# Patient Record
Sex: Female | Born: 1985 | Race: Black or African American | Hispanic: No | Marital: Single | Smoking: Never smoker
Health system: Southern US, Community
[De-identification: ages and names within clinical notes are randomized; demographics above are authoritative.]

## PROBLEM LIST (undated history)

## (undated) DIAGNOSIS — D219 Benign neoplasm of connective and other soft tissue, unspecified: Secondary | ICD-10-CM

## (undated) DIAGNOSIS — R87629 Unspecified abnormal cytological findings in specimens from vagina: Secondary | ICD-10-CM

## (undated) HISTORY — DX: Unspecified abnormal cytological findings in specimens from vagina: R87.629

## (undated) HISTORY — PX: OTHER SURGICAL HISTORY: SHX169

## (undated) HISTORY — PX: INDUCED ABORTION: SHX677

---
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
## 2005-01-29 ENCOUNTER — Emergency Department (HOSPITAL_COMMUNITY): Admission: EM | Admit: 2005-01-29 | Discharge: 2005-01-29 | Payer: Self-pay | Admitting: Emergency Medicine

## 2005-07-02 ENCOUNTER — Emergency Department (HOSPITAL_COMMUNITY): Admission: EM | Admit: 2005-07-02 | Discharge: 2005-07-02 | Payer: Self-pay | Admitting: Emergency Medicine

## 2005-11-13 ENCOUNTER — Inpatient Hospital Stay (HOSPITAL_COMMUNITY): Admission: AD | Admit: 2005-11-13 | Discharge: 2005-11-14 | Payer: Self-pay | Admitting: Obstetrics and Gynecology

## 2006-02-21 ENCOUNTER — Ambulatory Visit: Payer: Self-pay | Admitting: Family Medicine

## 2006-02-22 ENCOUNTER — Inpatient Hospital Stay (HOSPITAL_COMMUNITY): Admission: AD | Admit: 2006-02-22 | Discharge: 2006-02-24 | Payer: Self-pay | Admitting: Obstetrics and Gynecology

## 2006-02-22 ENCOUNTER — Ambulatory Visit: Payer: Self-pay | Admitting: Gynecology

## 2007-02-08 ENCOUNTER — Emergency Department (HOSPITAL_COMMUNITY): Admission: EM | Admit: 2007-02-08 | Discharge: 2007-02-08 | Payer: Self-pay | Admitting: Emergency Medicine

## 2008-02-19 ENCOUNTER — Emergency Department (HOSPITAL_COMMUNITY): Admission: EM | Admit: 2008-02-19 | Discharge: 2008-02-19 | Payer: Self-pay | Admitting: Emergency Medicine

## 2008-09-26 ENCOUNTER — Emergency Department (HOSPITAL_COMMUNITY): Admission: EM | Admit: 2008-09-26 | Discharge: 2008-09-26 | Payer: Self-pay | Admitting: Family Medicine

## 2010-02-02 ENCOUNTER — Emergency Department (HOSPITAL_COMMUNITY): Admission: EM | Admit: 2010-02-02 | Discharge: 2010-02-02 | Payer: Self-pay | Admitting: Emergency Medicine

## 2010-07-12 LAB — CULTURE, ROUTINE-ABSCESS

## 2017-06-16 ENCOUNTER — Emergency Department (HOSPITAL_BASED_OUTPATIENT_CLINIC_OR_DEPARTMENT_OTHER): Payer: Self-pay

## 2017-06-16 ENCOUNTER — Emergency Department (HOSPITAL_BASED_OUTPATIENT_CLINIC_OR_DEPARTMENT_OTHER)
Admission: EM | Admit: 2017-06-16 | Discharge: 2017-06-17 | Disposition: A | Discharge status: Home / Self Care | Payer: Self-pay | Attending: Emergency Medicine | Admitting: Emergency Medicine

## 2017-06-16 ENCOUNTER — Encounter (HOSPITAL_BASED_OUTPATIENT_CLINIC_OR_DEPARTMENT_OTHER): Payer: Self-pay | Admitting: Emergency Medicine

## 2017-06-16 ENCOUNTER — Other Ambulatory Visit: Payer: Self-pay

## 2017-06-16 DIAGNOSIS — R059 Cough, unspecified: Secondary | ICD-10-CM

## 2017-06-16 DIAGNOSIS — R05 Cough: Secondary | ICD-10-CM | POA: Insufficient documentation

## 2017-06-16 DIAGNOSIS — J029 Acute pharyngitis, unspecified: Secondary | ICD-10-CM | POA: Insufficient documentation

## 2017-06-16 DIAGNOSIS — R6883 Chills (without fever): Secondary | ICD-10-CM | POA: Insufficient documentation

## 2017-06-16 LAB — URINALYSIS, ROUTINE W REFLEX MICROSCOPIC
Glucose, UA: NEGATIVE mg/dL
Ketones, ur: 15 mg/dL — AB
Leukocytes, UA: NEGATIVE
Nitrite: NEGATIVE
PH: 6 (ref 5.0–8.0)
Protein, ur: NEGATIVE mg/dL
SPECIFIC GRAVITY, URINE: 1.025 (ref 1.005–1.030)

## 2017-06-16 LAB — COMPREHENSIVE METABOLIC PANEL
ALBUMIN: 3.9 g/dL (ref 3.5–5.0)
ALK PHOS: 61 U/L (ref 38–126)
ALT: 13 U/L — ABNORMAL LOW (ref 14–54)
ANION GAP: 10 (ref 5–15)
AST: 31 U/L (ref 15–41)
BUN: 11 mg/dL (ref 6–20)
CHLORIDE: 103 mmol/L (ref 101–111)
CO2: 24 mmol/L (ref 22–32)
Calcium: 8.6 mg/dL — ABNORMAL LOW (ref 8.9–10.3)
Creatinine, Ser: 1.1 mg/dL — ABNORMAL HIGH (ref 0.44–1.00)
GFR calc Af Amer: >60 mL/min (ref >60)
GFR calc non Af Amer: >60 mL/min (ref >60)
GLUCOSE: 102 mg/dL — AB (ref 65–99)
POTASSIUM: 3.5 mmol/L (ref 3.5–5.1)
Sodium: 137 mmol/L (ref 135–145)
Total Bilirubin: 0.5 mg/dL (ref 0.3–1.2)
Total Protein: 8.1 g/dL (ref 6.5–8.1)

## 2017-06-16 LAB — CBC
HEMATOCRIT: 34.9 % — AB (ref 36.0–46.0)
HEMOGLOBIN: 11.1 g/dL — AB (ref 12.0–15.0)
MCH: 23.6 pg — AB (ref 26.0–34.0)
MCHC: 31.8 g/dL (ref 30.0–36.0)
MCV: 74.3 fL — AB (ref 78.0–100.0)
Platelets: 215 10*3/uL (ref 150–400)
RBC: 4.7 MIL/uL (ref 3.87–5.11)
RDW: 15.4 % (ref 11.5–15.5)
WBC: 4.9 10*3/uL (ref 4.0–10.5)

## 2017-06-16 LAB — PREGNANCY, URINE: Preg Test, Ur: NEGATIVE

## 2017-06-16 LAB — LIPASE, BLOOD: Lipase: 40 U/L (ref 11–51)

## 2017-06-16 LAB — URINALYSIS, MICROSCOPIC (REFLEX): SQUAMOUS EPITHELIAL / LPF: NONE SEEN

## 2017-06-16 MED ORDER — ONDANSETRON 4 MG PO TBDP
4.0000 mg | ORAL_TABLET | Freq: Once | ORAL | Status: AC | PRN
  Administered 2017-06-16: 4 mg via ORAL
  Filled 2017-06-16: qty 1

## 2017-06-16 MED ORDER — BENZONATATE 100 MG PO CAPS
100.0000 mg | ORAL_CAPSULE | Freq: Once | ORAL | Status: AC
Start: 2017-06-16 — End: 2017-06-16
  Administered 2017-06-16: 100 mg via ORAL
  Filled 2017-06-16: qty 1

## 2017-06-16 NOTE — ED Provider Notes (Signed)
[facility] EMERGENCY DEPARTMENT Provider Note   CSN: [id number] Arrival date & time: 06/16/17  1928     History   Chief Complaint Chief Complaint  Patient presents with  . Emesis  . Cough    HPI Sandra Farrell is a 32 y.o. female.   Emesis   This is a new problem. The current episode started 2 days ago. The problem has been gradually worsening. The emesis has an appearance of stomach contents. Associated symptoms include chills and cough. Pertinent negatives include no abdominal pain, no diarrhea and no fever.  Cough  This is a new problem. The current episode started more than 2 days ago. The problem occurs every few minutes. The problem has been gradually worsening. The cough is non-productive. There has been no fever. Associated symptoms include chills and sore throat.  Reports onset of cough for 3 days ago.  She reports the cough is triggering vomiting.  No fever, no shortness of breath.  No abdominal pain.  No diarrhea.  She reports recent bout of influenza 2 weeks ago   PMH-none Soc hx - nonsmoker OB History    No data available       Home Medications    Prior to Admission medications   Not on File    Family History History reviewed. No pertinent family history.  Social History Social History   Tobacco Use  . Smoking status: Never Smoker  . Smokeless tobacco: Never Used  Substance Use Topics  . Alcohol use: No    Frequency: Never  . Drug use: No     Allergies   Patient has no known allergies.   Review of Systems Review of Systems  Constitutional: Positive for chills. Negative for fever.  HENT: Positive for sore throat.   Respiratory: Positive for cough.   Gastrointestinal: Positive for vomiting. Negative for abdominal pain and diarrhea.  Genitourinary: Negative for dysuria.  All other systems reviewed and are negative.    Physical Exam Updated Vital Signs BP 120/71 (BP Location: Right Arm)   Pulse 74   Temp 99 F (37.2  C) (Oral)   Resp 16   Ht 1.753 m (5\' 9" )   Wt 105.2 kg (232 lb)   LMP 06/16/2017   SpO2 94%   BMI 34.26 kg/m   Physical Exam CONSTITUTIONAL: Well developed/well nourished HEAD: Normocephalic/atraumatic EYES: EOMI/PERRL ENMT: Mucous membranes moist, uvula midline, erythema, no exudate NECK: supple no meningeal signs SPINE/BACK:entire spine nontender CV: S1/S2 noted, no murmurs/rubs/gallops noted LUNGS: Lungs are clear to auscultation bilaterally, no apparent distress ABDOMEN: soft, nontender, no rebound or guarding, bowel sounds noted throughout abdomen GU:no cva tenderness NEURO: Pt is awake/alert/appropriate, moves all extremitiesx4.  No facial droop.   EXTREMITIES: pulses normal/equal, full ROM SKIN: warm, color normal PSYCH: no abnormalities of mood noted, alert and oriented to situation   ED Treatments / Results  Labs (all labs ordered are listed, but only abnormal results are displayed) Labs Reviewed  COMPREHENSIVE METABOLIC PANEL - Abnormal; Notable for the following components:      Result Value   Glucose, Bld 102 (*)    Creatinine, Ser 1.10 (*)    Calcium 8.6 (*)    ALT 13 (*)    All other components within normal limits  CBC - Abnormal; Notable for the following components:   Hemoglobin 11.1 (*)    HCT 34.9 (*)    MCV 74.3 (*)    MCH 23.6 (*)    All other components within normal  limits  URINALYSIS, ROUTINE W REFLEX MICROSCOPIC - Abnormal; Notable for the following components:   Hgb urine dipstick SMALL (*)    Bilirubin Urine SMALL (*)    Ketones, ur 15 (*)    All other components within normal limits  URINALYSIS, MICROSCOPIC (REFLEX) - Abnormal; Notable for the following components:   Bacteria, UA MANY (*)    All other components within normal limits  LIPASE, BLOOD  PREGNANCY, URINE    EKG  EKG Interpretation None       Radiology Dg Chest 2 View  Result Date: 06/16/2017 CLINICAL DATA:  Cough x 4 days; vomiting x 3 days; no fever; nonsmoker;  no h/o asthma or lung problems EXAM: CHEST  2 VIEW COMPARISON:  None. FINDINGS: The heart size and mediastinal contours are within normal limits. Both lungs are clear. The visualized skeletal structures are unremarkable. IMPRESSION: No active cardiopulmonary disease. No evidence of pneumonia or pulmonary edema. Electronically Signed   By: Franki Cabot M.D.   On: 06/16/2017 20:41    Procedures Procedures  Medications Ordered in ED Medications  ondansetron (ZOFRAN-ODT) disintegrating tablet 4 mg (4 mg Oral Given 06/16/17 2022)  benzonatate (TESSALON) capsule 100 mg (100 mg Oral Given 06/16/17 2351)     Initial Impression / Assessment and Plan / ED Course  I have reviewed the triage vital signs and the nursing notes.  Pertinent labs & imaging results that were available during my care of the patient were reviewed by me and considered in my medical decision making (see chart for details).     In the emergency department with probable viral illness.  Chest x-ray negative.  No distress noted on my exam.  Will discharge home.  Final Clinical Impressions(s) / ED Diagnoses   Final diagnoses:  Cough    ED Discharge Orders        Ordered    benzonatate (TESSALON) 100 MG capsule  3 times daily PRN     06/17/17 0015       Ripley Fraise, MD 06/17/17 0151

## 2017-06-16 NOTE — ED Notes (Signed)
EDP into room, prior to RN assessment, see MD notes, pending orders.   

## 2017-06-16 NOTE — ED Notes (Signed)
Alert, NAD, calm, interactive, resps e/u, speaking in clear complete sentences, no dyspnea noted, skin W&D, VSS, here for productive cough, sore throat and NV, (denies: pain at this time, sob, nausea at this time, dizziness or visual changes).

## 2017-06-16 NOTE — ED Triage Notes (Signed)
Patient states that she has had N/V since Friday. Patient reports that LBM was on Thursday - denies any abdominal pain  - Patient reports that she constantly has Nausea

## 2017-06-17 MED ORDER — BENZONATATE 100 MG PO CAPS: 100.0000 mg | ORAL_CAPSULE | Freq: Three times a day (TID) | ORAL | 0 refills | Status: DC | PRN

## 2019-01-12 ENCOUNTER — Encounter: Payer: Self-pay | Admitting: Obstetrics & Gynecology

## 2019-01-12 DIAGNOSIS — Z01419 Encounter for gynecological examination (general) (routine) without abnormal findings: Secondary | ICD-10-CM

## 2019-03-31 ENCOUNTER — Telehealth: Payer: Self-pay

## 2019-03-31 NOTE — Telephone Encounter (Signed)
Pt called the office stating she is 7 w 2d pregnant and is expereincing nausea and vomiting. Pt states that she is unable to keep anything down. Advised pt to take Vitamin B6 100mg  up to 3 times a day and 1/2 of of a unisom up to 2 times a day. Pt advised to drink plenty of fluids.Pt also advised to go to [facility] at [facility] if symptoms does not improve. Pt is scheduled to be seen in the office on 04/29/19. Understanding was voiced.  Beula Joyner l Bisma Klett, CMA

## 2019-04-29 ENCOUNTER — Encounter: Payer: Self-pay | Admitting: Family Medicine

## 2019-04-29 ENCOUNTER — Ambulatory Visit (INDEPENDENT_AMBULATORY_CARE_PROVIDER_SITE_OTHER): Payer: Medicaid Other | Admitting: Family Medicine

## 2019-04-29 ENCOUNTER — Other Ambulatory Visit: Payer: Self-pay

## 2019-04-29 ENCOUNTER — Other Ambulatory Visit (HOSPITAL_COMMUNITY)
Admission: RE | Admit: 2019-04-29 | Discharge: 2019-04-29 | Disposition: A | Discharge status: Home / Self Care | Payer: Medicaid Other | Source: Ambulatory Visit | Attending: Family Medicine | Admitting: Family Medicine

## 2019-04-29 VITALS — BP 120/78 | HR 70 | Ht 70.0 in | Wt 225.0 lb

## 2019-04-29 DIAGNOSIS — D573 Sickle-cell trait: Secondary | ICD-10-CM

## 2019-04-29 DIAGNOSIS — Z3481 Encounter for supervision of other normal pregnancy, first trimester: Secondary | ICD-10-CM

## 2019-04-29 DIAGNOSIS — Z3480 Encounter for supervision of other normal pregnancy, unspecified trimester: Secondary | ICD-10-CM | POA: Insufficient documentation

## 2019-04-29 DIAGNOSIS — Z3A11 11 weeks gestation of pregnancy: Secondary | ICD-10-CM | POA: Diagnosis not present

## 2019-04-29 DIAGNOSIS — Z362 Encounter for other antenatal screening follow-up: Secondary | ICD-10-CM | POA: Diagnosis not present

## 2019-04-29 LAB — OB RESULTS CONSOLE GBS: GBS: POSITIVE

## 2019-04-29 MED ORDER — VITAMIN B-6 25 MG PO TABS: 25.0000 mg | ORAL_TABLET | Freq: Four times a day (QID) | ORAL | 3 refills | Status: DC

## 2019-04-29 MED ORDER — DOXYLAMINE SUCCINATE (SLEEP) 25 MG PO TABS: 25.0000 mg | ORAL_TABLET | Freq: Four times a day (QID) | ORAL | 2 refills | Status: DC | PRN

## 2019-04-29 NOTE — Progress Notes (Signed)
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  This was an unexpected pregnancy. She plans on keeping the pregnancy. In relationship with FOB. Her obstetrical history is significant for 2 prior vaginal deliveries.. Patient unsure about breast feeding. Pregnancy history fully reviewed.  Patient reports nausea.  BP 120/78   Pulse 70   Ht 5\' 10"  (1.778 m)   Wt 225 lb (102.1 kg)   LMP 02/08/2019   BMI 32.28 kg/m   HISTORY: OB History  Gravida Para Term Preterm AB Living  4 2 2  0 1 2  SAB TAB Ectopic Multiple Live Births  0 1 0 0 2    # Outcome Date GA Lbr Len/2nd Weight Sex Delivery Anes PTL Lv  4 Current           3 TAB 2018          2 Term 02/22/06 [redacted]w[redacted]d  8 lb 13 oz (3.997 kg) M    LIV  1 Term 12/14/03 [redacted]w[redacted]d  8 lb 6 oz (3.799 kg) M Vag-Spont   LIV    Past Medical History:  Diagnosis Date  . Vaginal Pap smear, abnormal     Past Surgical History:  Procedure Laterality Date  . foot surgery right      Family History  Problem Relation Age of Onset  . Hypertension Father   . Asthma Son   . Cancer Neg Hx   . Depression Neg Hx   . Stroke Neg Hx      Exam  BP 120/78   Pulse 70   Ht 5\' 10"  (1.778 m)   Wt 225 lb (102.1 kg)   LMP 02/08/2019   BMI 32.28 kg/m   Chaperone present during exam  CONSTITUTIONAL: Well-developed, well-nourished female in no acute distress.  HENT:  Normocephalic, atraumatic, External right and left ear normal. Oropharynx is clear and moist EYES: Conjunctivae and EOM are normal. Pupils are equal, round, and reactive to light. No scleral icterus.  NECK: Normal range of motion, supple, no masses.  Normal thyroid.  CARDIOVASCULAR: Normal heart rate noted, regular rhythm RESPIRATORY: Clear to auscultation bilaterally. Effort and breath sounds normal, no problems with respiration noted. BREASTS: Symmetric in size. No masses, skin changes, nipple drainage, or  lymphadenopathy. ABDOMEN: Soft, normal bowel sounds, no distention noted.  No tenderness, rebound or guarding.  PELVIC: Normal appearing external genitalia; normal appearing vaginal mucosa and cervix. No abnormal discharge noted. Normal uterine size, no other palpable masses, no uterine or adnexal tenderness. MUSCULOSKELETAL: Normal range of motion. No tenderness.  No cyanosis, clubbing, or edema.  2+ distal pulses. SKIN: Skin is warm and dry. No rash noted. Not diaphoretic. No erythema. No pallor. NEUROLOGIC: Alert and oriented to person, place, and time. Normal reflexes, muscle tone coordination. No cranial nerve deficit noted. PSYCHIATRIC: Normal mood and affect. Normal behavior. Normal judgment and thought content.    Assessment:    Pregnancy: [id number] Patient Active Problem List   Diagnosis Date Noted  . Supervision of other normal pregnancy, antepartum 04/29/2019  . Sickle cell trait ([place]) 04/29/2019      Plan:   1. Supervision of other normal pregnancy, antepartum - Cytology - PAP( [place]) - Obstetric Panel (and HIV)*LC - HgB A1c - Urine Culture - [place] MFM OB COMP + 14 WK; Future  2. Sickle cell trait ([place]) FOB not carrier     Problem list reviewed and updated. 75% of 30  min visit spent on counseling and coordination of care.     Truett Mainland 04/29/2019

## 2019-04-29 NOTE — Progress Notes (Signed)
  Patient complaining of nausea and vomiting. Kathrene Alu RN  DATING AND VIABILITY SONOGRAM   Sandra Farrell is a 33 y.o. year old [id number] with LMP Patient's last menstrual period was 02/08/2019. which would correlate to  [redacted]w[redacted]d weeks gestation.  She has regular menstrual cycles.   She is here today for a confirmatory initial sonogram.    GESTATION: [place]     FETAL ACTIVITY:          Heart rate       156          The fetus is active.  ADNEXA: The ovaries are normal.   GESTATIONAL AGE AND  BIOMETRICS:  Gestational criteria: Estimated Date of Delivery: 11/15/19 by LMP now at [redacted]w[redacted]d  Previous Scans:0      CROWN RUMP LENGTH           4.39 cm         11-1 weeks                                                                               AVERAGE EGA(BY THIS SCAN):  11-3 weeks  WORKING EDD( LMP ):  11/15/2019     TECHNICIAN COMMENTS: Patient informed that the ultrasound is considered a limited obstetric ultrasound and is not intended to be a complete ultrasound exam. Patient also informed that the ultrasound is not being completed with the intent of assessing for fetal or placental anomalies or any pelvic abnormalities. Explained that the purpose of today's ultrasound is to assess for fetal heart rate. Patient acknowledges the purpose of the exam and the limitations of the study.      Kathrene Alu 04/29/2019 2:14 PM

## 2019-04-30 LAB — OBSTETRIC PANEL, INCLUDING HIV
Antibody Screen: NEGATIVE
Basophils Absolute: 0 10*3/uL (ref 0.0–0.2)
Basos: 0 %
EOS (ABSOLUTE): 0 10*3/uL (ref 0.0–0.4)
Eos: 0 %
HIV Screen 4th Generation wRfx: NONREACTIVE
Hematocrit: 34.7 % (ref 34.0–46.6)
Hemoglobin: 11.2 g/dL (ref 11.1–15.9)
Hepatitis B Surface Ag: NEGATIVE
Immature Grans (Abs): 0 10*3/uL (ref 0.0–0.1)
Immature Granulocytes: 0 %
Lymphocytes Absolute: 1.8 10*3/uL (ref 0.7–3.1)
Lymphs: 26 %
MCH: 25.6 pg — ABNORMAL LOW (ref 26.6–33.0)
MCHC: 32.3 g/dL (ref 31.5–35.7)
MCV: 79 fL (ref 79–97)
Monocytes Absolute: 0.4 10*3/uL (ref 0.1–0.9)
Monocytes: 5 %
Neutrophils Absolute: 4.7 10*3/uL (ref 1.4–7.0)
Neutrophils: 69 %
Platelets: 227 10*3/uL (ref 150–450)
RBC: 4.37 x10E6/uL (ref 3.77–5.28)
RDW: 15.8 % — ABNORMAL HIGH (ref 11.7–15.4)
RPR Ser Ql: NONREACTIVE
Rh Factor: POSITIVE
Rubella Antibodies, IGG: 1.12 index (ref >0.99)
WBC: 6.8 10*3/uL (ref 3.4–10.8)

## 2019-04-30 LAB — HEMOGLOBIN A1C
Est. average glucose Bld gHb Est-mCnc: 111 mg/dL
Hgb A1c MFr Bld: 5.5 % (ref 4.8–5.6)

## 2019-05-01 ENCOUNTER — Encounter: Payer: Self-pay | Admitting: Family Medicine

## 2019-05-01 DIAGNOSIS — R8271 Bacteriuria: Secondary | ICD-10-CM | POA: Insufficient documentation

## 2019-05-01 LAB — URINE CULTURE

## 2019-05-01 NOTE — L&D Delivery Note (Signed)
OB/GYN Faculty Practice Delivery Note  Sandra Farrell is a 34 y.o. [id number] s/p NSVD at [redacted]w[redacted]d. She was admitted for SROM.   ROM: 15h 55m with clear fluid GBS Status: Positive/-- (12/30 0000), received adequate treatment Maximum Maternal Temperature: 98.4 F    Labor Progress: . Patient arrived at 2.5 cm dilation and was induced initially with misoprostol. She progressed to 5.5 cm and was then started on pitocin after which she quickly progressed to complete.   Delivery Date/Time: 11/05/2019 at 1843 Delivery: Called to room and patient was complete and pushing. Head delivered in OA position compound with left hand. No nuchal cord present. The left hand and wrist were posterior, they were grasped and delivered along with the posterior shoulder, after which the anterior shoulder delivered easily in the usual fashion. Body delivered in usual fashion. Infant with spontaneous cry, placed on mother's abdomen, dried and stimulated. Cord clamped x 2 after 1-minute delay, and cut by patient. Cord blood drawn. Placenta delivered spontaneously with gentle cord traction. Fundus firm with massage and Pitocin. Labia, perineum, vagina, and cervix inspected with no lacerations.   Placenta: 3v intact to L&D Complications: none Lacerations: none EBL: 400 cc Analgesia: epidural   Infant: Female infant  APGAR (1 MIN): 8   APGAR (5 MINS): 9    Weight: 3396 grams  Augustin Coupe, MD/[facility] OB/GYN Fellow, Faculty Practice

## 2019-05-04 LAB — CYTOLOGY - PAP
Adequacy: ABSENT
Chlamydia: NEGATIVE
Comment: NEGATIVE
Comment: NEGATIVE
Comment: NORMAL
Diagnosis: NEGATIVE
High risk HPV: NEGATIVE
Neisseria Gonorrhea: NEGATIVE

## 2019-05-12 MED ORDER — ONDANSETRON 4 MG PO TBDP: 4.0000 mg | ORAL_TABLET | Freq: Four times a day (QID) | ORAL | 4 refills | Status: DC | PRN

## 2019-05-14 ENCOUNTER — Encounter: Payer: Self-pay | Admitting: Family Medicine

## 2019-05-28 ENCOUNTER — Ambulatory Visit (INDEPENDENT_AMBULATORY_CARE_PROVIDER_SITE_OTHER): Payer: Medicaid Other | Admitting: Family Medicine

## 2019-05-28 ENCOUNTER — Other Ambulatory Visit: Payer: Self-pay

## 2019-05-28 VITALS — BP 119/77 | HR 82 | Wt 227.0 lb

## 2019-05-28 DIAGNOSIS — Z3480 Encounter for supervision of other normal pregnancy, unspecified trimester: Secondary | ICD-10-CM | POA: Diagnosis not present

## 2019-05-28 DIAGNOSIS — Z3A15 15 weeks gestation of pregnancy: Secondary | ICD-10-CM

## 2019-05-28 DIAGNOSIS — R8271 Bacteriuria: Secondary | ICD-10-CM

## 2019-05-28 DIAGNOSIS — Z3481 Encounter for supervision of other normal pregnancy, first trimester: Secondary | ICD-10-CM | POA: Diagnosis not present

## 2019-05-28 DIAGNOSIS — Z3482 Encounter for supervision of other normal pregnancy, second trimester: Secondary | ICD-10-CM

## 2019-05-28 DIAGNOSIS — D573 Sickle-cell trait: Secondary | ICD-10-CM

## 2019-05-28 NOTE — Progress Notes (Signed)
   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  She is currently monitored for the following issues for this low-risk pregnancy and has Supervision of other normal pregnancy, antepartum; Sickle cell trait ([place]); and GBS bacteriuria on their problem list.  Patient reports no complaints.  Contractions: Not present. Vag. Bleeding: None.   . Denies leaking of fluid.   The following portions of the patient's history were reviewed and updated as appropriate: allergies, current medications, past family history, past medical history, past social history, past surgical history and problem list.   Objective:   Vitals:   05/28/19 0837  BP: 119/77  Pulse: 82  Weight: 227 lb (103 kg)    Fetal Status: Fetal Heart Rate (bpm): 145         General:  Alert, oriented and cooperative. Patient is in no acute distress.  Skin: Skin is warm and dry. No rash noted.   Cardiovascular: Normal heart rate noted  Respiratory: Normal respiratory effort, no problems with respiration noted  Abdomen: Soft, gravid, appropriate for gestational age.  Pain/Pressure: Present     Pelvic: Cervical exam deferred        Extremities: Normal range of motion.  Edema: None  Mental Status: Normal mood and affect. Normal behavior. Normal judgment and thought content.   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 Supervision of other normal pregnancy, antepartum FHT and FH normal - Genetic Screening - AFP, Serum, Open Spina Bifida  2. GBS bacteriuria Intrapartum PPx  3. Sickle cell trait (Sandra Farrell)   Preterm labor symptoms and general obstetric precautions including but not limited to vaginal bleeding, contractions, leaking of fluid and fetal movement were reviewed in detail with the patient. Please refer to After Visit Summary for other counseling recommendations.   Return in about 4 weeks (around 06/25/2019) for OB f/u.  Future  Appointments  Date Time Provider [place]  06/22/2019  2:30 PM [facility] [place] 1 [facility]US [facility]-US  06/26/2019  8:30 AM Truett Mainland, DO [facility] None    Truett Mainland, DO

## 2019-05-30 LAB — AFP, SERUM, OPEN SPINA BIFIDA
AFP MoM: 1.31
AFP Value: 34.4 ng/mL
Gest. Age on Collection Date: 15.4 weeks
Maternal Age At EDD: 34.8 yr
OSBR Risk 1 IN: 9327
Test Results:: NEGATIVE
Weight: 227 [lb_av]

## 2019-06-09 ENCOUNTER — Other Ambulatory Visit: Payer: Self-pay

## 2019-06-09 DIAGNOSIS — B9689 Other specified bacterial agents as the cause of diseases classified elsewhere: Secondary | ICD-10-CM

## 2019-06-09 MED ORDER — METRONIDAZOLE 500 MG PO TABS: 500.0000 mg | ORAL_TABLET | Freq: Two times a day (BID) | ORAL | 0 refills | Status: DC

## 2019-06-09 NOTE — Progress Notes (Signed)
Pt sent Mychart message stating she is having some gray discharge with a fishy odor. Flagyl 500 mg PO BID x 7 days was sent to her pharmacy.  Dorean Hiebert l Rashard Ryle, CMA

## 2019-06-22 ENCOUNTER — Other Ambulatory Visit (HOSPITAL_COMMUNITY): Payer: Self-pay | Admitting: *Deleted

## 2019-06-22 ENCOUNTER — Other Ambulatory Visit: Payer: Self-pay

## 2019-06-22 ENCOUNTER — Other Ambulatory Visit: Payer: Self-pay | Admitting: Family Medicine

## 2019-06-22 ENCOUNTER — Encounter: Payer: Self-pay | Admitting: Family Medicine

## 2019-06-22 ENCOUNTER — Telehealth: Payer: Self-pay

## 2019-06-22 ENCOUNTER — Ambulatory Visit (HOSPITAL_COMMUNITY)
Admission: RE | Admit: 2019-06-22 | Discharge: 2019-06-22 | Disposition: A | Discharge status: Home / Self Care | Payer: Medicaid Other | Source: Ambulatory Visit | Attending: Family Medicine | Admitting: Family Medicine

## 2019-06-22 DIAGNOSIS — Z3A19 19 weeks gestation of pregnancy: Secondary | ICD-10-CM | POA: Diagnosis not present

## 2019-06-22 DIAGNOSIS — D259 Leiomyoma of uterus, unspecified: Secondary | ICD-10-CM | POA: Diagnosis not present

## 2019-06-22 DIAGNOSIS — O3412 Maternal care for benign tumor of corpus uteri, second trimester: Secondary | ICD-10-CM | POA: Diagnosis not present

## 2019-06-22 DIAGNOSIS — Z3480 Encounter for supervision of other normal pregnancy, unspecified trimester: Secondary | ICD-10-CM | POA: Diagnosis not present

## 2019-06-22 DIAGNOSIS — D563 Thalassemia minor: Secondary | ICD-10-CM | POA: Insufficient documentation

## 2019-06-22 NOTE — Telephone Encounter (Signed)
Received a fax from [place] stating that they are  unable to teach pt to schedule [employer] information session.  Jaleeah Slight l Izela Altier, CMA

## 2019-06-26 ENCOUNTER — Encounter: Payer: Self-pay | Admitting: Family Medicine

## 2019-06-26 ENCOUNTER — Ambulatory Visit (INDEPENDENT_AMBULATORY_CARE_PROVIDER_SITE_OTHER): Payer: Medicaid Other | Admitting: Family Medicine

## 2019-06-26 ENCOUNTER — Other Ambulatory Visit: Payer: Self-pay

## 2019-06-26 VITALS — BP 132/81 | HR 91 | Wt 230.0 lb

## 2019-06-26 DIAGNOSIS — D259 Leiomyoma of uterus, unspecified: Secondary | ICD-10-CM

## 2019-06-26 DIAGNOSIS — R8271 Bacteriuria: Secondary | ICD-10-CM

## 2019-06-26 DIAGNOSIS — Z3046 Encounter for surveillance of implantable subdermal contraceptive: Secondary | ICD-10-CM

## 2019-06-26 DIAGNOSIS — D563 Thalassemia minor: Secondary | ICD-10-CM

## 2019-06-26 DIAGNOSIS — Z3493 Encounter for supervision of normal pregnancy, unspecified, third trimester: Secondary | ICD-10-CM

## 2019-06-26 DIAGNOSIS — Z3480 Encounter for supervision of other normal pregnancy, unspecified trimester: Secondary | ICD-10-CM

## 2019-06-26 DIAGNOSIS — D573 Sickle-cell trait: Secondary | ICD-10-CM

## 2019-06-26 NOTE — Progress Notes (Signed)
   PRENATAL VISIT NOTE  Subjective:  Sandra Farrell is a 34 y.o. [id number] at [redacted]w[redacted]d being seen today for ongoing prenatal care.  She is currently monitored for the following issues for this low-risk pregnancy and has Supervision of other normal pregnancy, antepartum; Sickle cell trait ([place]); GBS bacteriuria; and Alpha thalassemia silent carrier on their problem list.  Patient reports no complaints.  Contractions: Not present. Vag. Bleeding: None.   . Denies leaking of fluid.   The following portions of the patient's history were reviewed and updated as appropriate: allergies, current medications, past family history, past medical history, past social history, past surgical history and problem list.   Objective:   Vitals:   06/26/19 0832  BP: 132/81  Pulse: 91  Weight: 230 lb (104.3 kg)    Fetal Status: Fetal Heart Rate (bpm): 145         General:  Alert, oriented and cooperative. Patient is in no acute distress.  Skin: Skin is warm and dry. No rash noted.   Cardiovascular: Normal heart rate noted  Respiratory: Normal respiratory effort, no problems with respiration noted  Abdomen: Soft, gravid, appropriate for gestational age.  Pain/Pressure: Present     Pelvic: Cervical exam deferred        Extremities: Normal range of motion.  Edema: None  Mental Status: Normal mood and affect. Normal behavior. Normal judgment and thought content.    Nexplanon Removal:  Patient given informed consent for removal of her Implanon, time out was performed.  Signed copy in the chart.  Appropriate time out taken. Implanon site identified.  Area prepped in usual sterile fashon. 1% lidocaine 32mL was used to anesthetize the area at the distal end of the implant. A small stab incision was made right beside the implant on the distal portion.  The implanon rod was grasped using hemostats and removed without difficulty.  There was less than 3 cc blood loss. There were no complications.  A small amount of  antibiotic ointment and steri-strips were applied over the small incision.  A pressure bandage was applied to reduce any bruising.  The patient tolerated the procedure well and was given post procedure instructions.    Assessment and Plan:  Pregnancy: [id number] at [redacted]w[redacted]d 1. Supervision of other normal pregnancy, antepartum FHT and FH normal  2. GBS bacteriuria Intrapartum PPX  3. Alpha thalassemia silent carrier  4. Sickle cell trait ([place])  5. Uterine leiomyoma, unspecified location Discussed fibroid - LUS 4.5cm. Likely won't cause a problem  6. Nexplanon removal In discussed contraception with patient, she mentioned that she still had a nexplanon in her arm. [facility], but couldn't. It is about 35 years old. Removed as above.  Preterm labor symptoms and general obstetric precautions including but not limited to vaginal bleeding, contractions, leaking of fluid and fetal movement were reviewed in detail with the patient. Please refer to After Visit Summary for other counseling recommendations.   Return in about 4 weeks (around 07/24/2019) for OB f/u, In Office.  Future Appointments  Date Time Provider [place]  09/07/2019  8:15 AM [facility] [place], DO

## 2019-07-10 ENCOUNTER — Other Ambulatory Visit: Payer: Self-pay

## 2019-07-10 ENCOUNTER — Encounter (HOSPITAL_COMMUNITY): Payer: Self-pay | Admitting: Obstetrics and Gynecology

## 2019-07-10 ENCOUNTER — Inpatient Hospital Stay (HOSPITAL_COMMUNITY): Payer: Medicaid Other

## 2019-07-10 ENCOUNTER — Inpatient Hospital Stay (HOSPITAL_COMMUNITY)
Admission: AD | Admit: 2019-07-10 | Discharge: 2019-07-10 | Disposition: A | Discharge status: Home / Self Care | Payer: Medicaid Other | Attending: Obstetrics and Gynecology | Admitting: Obstetrics and Gynecology

## 2019-07-10 ENCOUNTER — Telehealth: Payer: Self-pay

## 2019-07-10 DIAGNOSIS — B9689 Other specified bacterial agents as the cause of diseases classified elsewhere: Secondary | ICD-10-CM | POA: Diagnosis not present

## 2019-07-10 DIAGNOSIS — R1030 Lower abdominal pain, unspecified: Secondary | ICD-10-CM | POA: Diagnosis not present

## 2019-07-10 DIAGNOSIS — O4692 Antepartum hemorrhage, unspecified, second trimester: Secondary | ICD-10-CM | POA: Insufficient documentation

## 2019-07-10 DIAGNOSIS — D259 Leiomyoma of uterus, unspecified: Secondary | ICD-10-CM | POA: Diagnosis not present

## 2019-07-10 DIAGNOSIS — O209 Hemorrhage in early pregnancy, unspecified: Secondary | ICD-10-CM | POA: Diagnosis not present

## 2019-07-10 DIAGNOSIS — O3412 Maternal care for benign tumor of corpus uteri, second trimester: Secondary | ICD-10-CM | POA: Diagnosis not present

## 2019-07-10 DIAGNOSIS — O23592 Infection of other part of genital tract in pregnancy, second trimester: Secondary | ICD-10-CM | POA: Insufficient documentation

## 2019-07-10 DIAGNOSIS — Z3A21 21 weeks gestation of pregnancy: Secondary | ICD-10-CM | POA: Diagnosis not present

## 2019-07-10 DIAGNOSIS — O4693 Antepartum hemorrhage, unspecified, third trimester: Secondary | ICD-10-CM

## 2019-07-10 DIAGNOSIS — N760 Acute vaginitis: Secondary | ICD-10-CM

## 2019-07-10 DIAGNOSIS — Z3A00 Weeks of gestation of pregnancy not specified: Secondary | ICD-10-CM | POA: Diagnosis not present

## 2019-07-10 DIAGNOSIS — O3410 Maternal care for benign tumor of corpus uteri, unspecified trimester: Secondary | ICD-10-CM

## 2019-07-10 LAB — URINALYSIS, ROUTINE W REFLEX MICROSCOPIC
Bilirubin Urine: NEGATIVE
Glucose, UA: NEGATIVE mg/dL
Ketones, ur: NEGATIVE mg/dL
Leukocytes,Ua: NEGATIVE
Nitrite: NEGATIVE
Protein, ur: NEGATIVE mg/dL
Specific Gravity, Urine: 1.018 (ref 1.005–1.030)
pH: 6 (ref 5.0–8.0)

## 2019-07-10 LAB — WET PREP, GENITAL
Sperm: NONE SEEN
Trich, Wet Prep: NONE SEEN
Yeast Wet Prep HPF POC: NONE SEEN

## 2019-07-10 MED ORDER — METRONIDAZOLE 500 MG PO TABS: 500.0000 mg | ORAL_TABLET | Freq: Two times a day (BID) | ORAL | 0 refills | Status: AC

## 2019-07-10 MED ORDER — ACETAMINOPHEN 500 MG PO TABS
1000.0000 mg | ORAL_TABLET | Freq: Once | ORAL | Status: AC
  Administered 2019-07-10: 1000 mg via ORAL
  Filled 2019-07-10: qty 2

## 2019-07-10 NOTE — MAU Provider Note (Signed)
History     CSN: [id number]  Arrival date and time: 07/10/19 1019   First Provider Initiated Contact with Patient 07/10/19 1138      Chief Complaint  Patient presents with  . Vaginal Bleeding  . Abdominal Pain   HPI   [name] is a 34 y.o. female [phone] @ 110w5d here In MAU with new onset vaginal bleeding that she noticed in the shower today. The bleeding currently has stopped. No recent intercourse. The bleeding is described as bright red; no clots. She attests to lower abdominal cramping that started after the bleeding. She rates her pain 5/10 at this time. She has not taken anything for the pain. + fetal movement.   OB History    Gravida  4   Para  2   Term  2   Preterm  0   AB  1   Living  2     SAB  0   TAB  1   Ectopic  0   Multiple  0   Live Births  2           Past Medical History:  Diagnosis Date  . Vaginal Pap smear, abnormal     Past Surgical History:  Procedure Laterality Date  . foot surgery right      Family History  Problem Relation Age of Onset  . Hypertension Father   . Asthma Son   . Cancer Neg Hx   . Depression Neg Hx   . Stroke Neg Hx     Social History   Tobacco Use  . Smoking status: Never Smoker  . Smokeless tobacco: Never Used  Substance Use Topics  . Alcohol use: Not Currently    Comment: not in pregnancy  . Drug use: No    Allergies: No Known Allergies  Medications Prior to Admission  Medication Sig Dispense Refill Last Dose  . Prenatal Vit-Fe Fumarate-FA (MULTIVITAMIN-PRENATAL) 27-0.8 MG TABS tablet Take 1 tablet by mouth daily at 12 noon.   07/09/2019 at Unknown time  . vitamin B-6 (PYRIDOXINE) 25 MG tablet Take 1 tablet (25 mg total) by mouth every 6 (six) hours. 180 tablet 3 07/09/2019 at Unknown time  . doxylamine, Sleep, (UNISOM) 25 MG tablet Take 1 tablet (25 mg total) by mouth 4 (four) times daily as needed (nausea and vomiting). 30 tablet 2   . metroNIDAZOLE (FLAGYL) 500 MG tablet Take  1 tablet (500 mg total) by mouth 2 (two) times daily. 14 tablet 0   . ondansetron (ZOFRAN ODT) 4 MG disintegrating tablet Take 1 tablet (4 mg total) by mouth every 6 (six) hours as needed for nausea. 30 tablet 4 More than a month at Unknown time   Results for orders placed or performed during the hospital encounter of 07/10/19 (from the past 48 hour(s))  Urinalysis, Routine w reflex microscopic     Status: Abnormal   Collection Time: 07/10/19 11:11 AM  Result Value Ref Range   Color, Urine YELLOW YELLOW   APPearance CLEAR CLEAR   Specific Gravity, Urine 1.018 1.005 - 1.030   pH 6.0 5.0 - 8.0   Glucose, UA NEGATIVE NEGATIVE mg/dL   Hgb urine dipstick SMALL (A) NEGATIVE   Bilirubin Urine NEGATIVE NEGATIVE   Ketones, ur NEGATIVE NEGATIVE mg/dL   Protein, ur NEGATIVE NEGATIVE mg/dL   Nitrite NEGATIVE NEGATIVE   Leukocytes,Ua NEGATIVE NEGATIVE   RBC / HPF 0-5 0 - 5 RBC/hpf   WBC, UA 0-5 0 - 5 WBC/hpf  Bacteria, UA FEW (A) NONE SEEN   Squamous Epithelial / LPF 0-5 0 - 5   Mucus PRESENT     Comment: Performed at [facility], [place] [address] [place], [place] [address]  Wet prep, genital     Status: Abnormal   Collection Time: 07/10/19 12:11 PM   Specimen: Vaginal  Result Value Ref Range   Yeast Wet Prep HPF POC NONE SEEN NONE SEEN   Trich, Wet Prep NONE SEEN NONE SEEN   Clue Cells Wet Prep HPF POC PRESENT (A) NONE SEEN   WBC, Wet Prep HPF POC MANY (A) NONE SEEN   Sperm NONE SEEN     Comment: Performed at [facility], [place] [address] [place], [place] [address]   [place] [employer]  Result Date: 07/10/2019 CLINICAL DATA:  34 year old pregnant female presents with vaginal bleeding. EXAM: LIMITED OBSTETRIC ULTRASOUND FINDINGS: Number of Fetuses: 1 Heart Rate:  152 bpm Movement: Yes Presentation: Transverse with fetal head on maternal left Placental Location: Anterior.  No placental abruption demonstrated. Previa: No Amniotic Fluid (Subjective):  Within normal limits. Deepest  vertical fluid pocket 4.7 cm Fetal anatomy and fetal biometry not assessed. MATERNAL FINDINGS: Cervix: Cervix length approximately 4.2 cm on transabdominal views. No evidence of internal cervical funneling. Uterus/Adnexae: No adnexal masses demonstrated. Anterior lower uterine segment 3.4 x 2.5 x 3.3 cm intramural fibroid. IMPRESSION: 1. Single living intrauterine gestation in transverse lie with normal fetal cardiac activity. 2. No acute gestational abnormality detected. Normal amniotic fluid volume. Normal cervix. 3. Solitary 3.4 cm anterior lower uterine segment fibroid. This exam is performed on an emergent basis and does not comprehensively evaluate fetal size, dating, or anatomy; follow-up complete OB US should be considered if further fetal assessment is warranted. Electronically Signed   By: Ilona Sorrel M.D.   On: 07/10/2019 12:40   Review of Systems  Constitutional: Negative for fever.  Gastrointestinal: Positive for abdominal pain.  Genitourinary: Positive for vaginal bleeding and vaginal discharge.   Physical Exam   Blood pressure 131/70, pulse 92, temperature 98.6 F (37 C), temperature source Oral, resp. rate 16, weight 103.8 kg, last menstrual period 02/08/2019, SpO2 98 %.  Physical Exam  Constitutional: She is oriented to person, place, and time. She appears well-developed and well-nourished. No distress.  HENT:  Head: Normocephalic.  Eyes: Pupils are equal, round, and reactive to light.  GI: Soft. She exhibits no distension. There is no abdominal tenderness. There is no rebound.  Genitourinary:    Genitourinary Comments: Vagina - Small amount of pink vaginal discharge, no odor  Cervix - No contact bleeding, no active bleeding from OS Bimanual exam: Cervix closed, posterior  GC/Chlam, wet prep done Chaperone present for exam.    Musculoskeletal:        General: Normal range of motion.     Cervical back: Neck supple.  Neurological: She is alert and oriented to person,  place, and time.  Skin: Skin is warm. She is not diaphoretic.  Psychiatric: Her behavior is normal.    MAU Course  Procedures  None  MDM  O positive blood type Tylenol given for pain Discussed wet prep results showing BV; will treat based on bloody discharge.   Assessment and Plan   A:  1. Uterine fibroid in pregnancy   2. [redacted] weeks gestation of pregnancy   3. Vaginal bleeding in pregnancy, third trimester   4. Bacterial vaginosis      P:  Discharge home in stable condition  Rx: Flagyl Pelvic rest- no intercourse F/u with the office next week for f/u. Message sent to [facility] office. Bleeding precautions Placenta Anterior- no previa.    Noni Saupe I, NP 07/10/2019 7:01 PM

## 2019-07-10 NOTE — Telephone Encounter (Signed)
Pt called the office stating she noticed blood going down her leg as she stepped out of the shower. Advised pt to go to [facility] to be seen. Understanding was voiced.  Lenell Mcconnell l Tahani Potier, CMA

## 2019-07-10 NOTE — Discharge Instructions (Signed)
Activity Restriction During Pregnancy Your health care provider may recommend specific activity restrictions during pregnancy for a variety of reasons. Activity restriction may require that you limit activities that require great effort, such as exercise, lifting, or sex. The type of activity restriction will vary for each person, depending on your risk or the problems you are having. Activity restriction may be recommended for a period of time until your baby is delivered. Why are activity restrictions recommended? Activity restriction may be recommended if:  Your placenta is partially or completely covering the opening of your cervix (placenta previa).  There is bleeding between the wall of the uterus and the amniotic sac in the first trimester of pregnancy (subchorionic hemorrhage).  You went into labor too early (preterm labor).  You have a history of miscarriage.  You have a condition that causes high blood pressure during pregnancy (preeclampsia or eclampsia).  You are pregnant with more than one baby.  Your baby is not growing well. What are the risks? The risks depend on your specific restriction. Strict bed rest has the most physical and emotional risks and is no longer routinely recommended. Risks of strict bed rest include:  Loss of muscle conditioning from not moving.  Blood clots.  Social isolation.  Depression.  Loss of income. Talk with your health care team about activity restriction to decide if it is best for you and your baby. Even if you are having problems during your pregnancy, you may be able to continue with normal levels of activity with careful monitoring by your health care team. Follow these instructions at home: If needed, based on your overall health and the health of your baby, your health care provider will decide which type of activity restriction is right for you. Activity restrictions may include:  Not lifting anything heavier than 10 pounds (4.5  kg).  Avoiding activities that take a lot of physical effort.  No lifting or straining.  Resting in a sitting position or lying down for periods of time during the day. Pelvic rest may be recommended along with activity restrictions. If pelvic rest is recommended, then:  Do not have sex, an orgasm, or use sexual stimulation.  Do not use tampons. Do not douche. Do not put anything into your vagina.  Do not lift anything that is heavier than 10 lb (4.5 kg).  Avoid activities that require a lot of effort.  Avoid any activity in which your pelvic muscles could become strained, such as squatting. Questions to ask your health care provider  Why is my activity being limited?  How will activity restrictions affect my body?  Why is rest helpful for me and my baby?  What activities can I do?  When can I return to normal activities? When should I seek immediate medical care? Seek immediate medical care if you have:  Vaginal bleeding.  Vaginal discharge.  Cramping pain in your lower abdomen.  Regular contractions.  A low, dull backache. Summary  Your health care provider may recommend specific activity restrictions during pregnancy for a variety of reasons.  Activity restriction may require that you limit activities such as exercise, lifting, sex, or any other activity that requires great effort.  Discuss the risks and benefits of activity restriction with your health care team to decide if it is best for you and your baby.  Contact your health care provider right away if you think you are having contractions, or if you notice vaginal bleeding, discharge, or cramping. This information is not   intended to replace advice given to you by your health care provider. Make sure you discuss any questions you have with your health care provider. Document Revised: 01/07/2019 Document Reviewed: 08/06/2017 Elsevier Patient Education  2020 Elsevier Inc.  

## 2019-07-10 NOTE — MAU Note (Signed)
.  Sandra Farrell is a 34 y.o. at [redacted]w[redacted]d here in MAU reporting: bright red vaginal bleeding she noticed this AM while showering.  Not bleeding now.  Reports good fetal movement.  Denies LOF or other discharge.  Pain score: 8   Lab orders placed from triage: UA

## 2019-07-13 LAB — GC/CHLAMYDIA PROBE AMP (~~LOC~~) NOT AT ARMC
Chlamydia: NEGATIVE
Comment: NEGATIVE
Comment: NORMAL
Neisseria Gonorrhea: NEGATIVE

## 2019-07-16 ENCOUNTER — Telehealth (INDEPENDENT_AMBULATORY_CARE_PROVIDER_SITE_OTHER): Payer: Medicaid Other | Admitting: Family Medicine

## 2019-07-16 DIAGNOSIS — O99012 Anemia complicating pregnancy, second trimester: Secondary | ICD-10-CM | POA: Diagnosis not present

## 2019-07-16 DIAGNOSIS — R8271 Bacteriuria: Secondary | ICD-10-CM | POA: Diagnosis not present

## 2019-07-16 DIAGNOSIS — O3412 Maternal care for benign tumor of corpus uteri, second trimester: Secondary | ICD-10-CM | POA: Diagnosis not present

## 2019-07-16 DIAGNOSIS — Z3A22 22 weeks gestation of pregnancy: Secondary | ICD-10-CM

## 2019-07-16 DIAGNOSIS — D573 Sickle-cell trait: Secondary | ICD-10-CM

## 2019-07-16 DIAGNOSIS — D259 Leiomyoma of uterus, unspecified: Secondary | ICD-10-CM

## 2019-07-16 DIAGNOSIS — D563 Thalassemia minor: Secondary | ICD-10-CM | POA: Diagnosis not present

## 2019-07-16 DIAGNOSIS — Z3480 Encounter for supervision of other normal pregnancy, unspecified trimester: Secondary | ICD-10-CM

## 2019-07-16 NOTE — Progress Notes (Signed)
   TELEHEALTH VIRTUAL OBSTETRICS VISIT ENCOUNTER NOTE  I connected with Sandra Farrell on 07/16/19 at  8:15 AM EDT by telephone at home and verified that I am speaking with the correct person using two identifiers.   I discussed the limitations, risks, security and privacy concerns of performing an evaluation and management service by telephone and the availability of in person appointments. I also discussed with the patient that there may be a patient responsible charge related to this service. The patient expressed understanding and agreed to proceed.  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  She is currently monitored for the following issues for this high-risk pregnancy and has Supervision of other normal pregnancy, antepartum; Sickle cell trait ([place]); GBS bacteriuria; Alpha thalassemia silent carrier; and Uterine fibroid in pregnancy on their problem list.  Patient reports bleeding improved. Having round ligament pain. getting abdominal band.. Reports fetal movement. Denies any contractions, bleeding or leaking of fluid.   The following portions of the patient's history were reviewed and updated as appropriate: allergies, current medications, past family history, past medical history, past social history, past surgical history and problem list.   Objective:   General:  Alert, oriented and cooperative.   Mental Status: Normal mood and affect perceived. Normal judgment and thought content.  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 Supervision of other normal pregnancy, antepartum Good fetal movement.  2. Uterine fibroid in pregnancy stable  3. GBS bacteriuria Intrapartum PPX  4. Alpha thalassemia silent carrier  5. Sickle cell trait ([place])   Preterm labor symptoms and general obstetric precautions including but not limited to vaginal  bleeding, contractions, leaking of fluid and fetal movement were reviewed in detail with the patient.  I discussed the assessment and treatment plan with the patient. The patient was provided an opportunity to ask questions and all were answered. The patient agreed with the plan and demonstrated an understanding of the instructions. The patient was advised to call back or seek an in-person office evaluation/go to MAU at [facility] for any urgent or concerning symptoms. Please refer to After Visit Summary for other counseling recommendations.   I provided 11 minutes of non-face-to-face time during this encounter.  No follow-ups on file.  Future Appointments  Date Time Provider [place]  07/28/2019  8:30 AM Seabron Spates, CNM [facility] None  09/07/2019  8:15 AM [place] for [employer], [place]

## 2019-07-28 ENCOUNTER — Encounter: Payer: Self-pay | Admitting: Advanced Practice Midwife

## 2019-07-28 ENCOUNTER — Other Ambulatory Visit: Payer: Self-pay

## 2019-07-28 ENCOUNTER — Ambulatory Visit (INDEPENDENT_AMBULATORY_CARE_PROVIDER_SITE_OTHER): Payer: Medicaid Other | Admitting: Advanced Practice Midwife

## 2019-07-28 VITALS — BP 105/59 | HR 82 | Wt 235.1 lb

## 2019-07-28 DIAGNOSIS — M6208 Separation of muscle (nontraumatic), other site: Secondary | ICD-10-CM

## 2019-07-28 DIAGNOSIS — Z3480 Encounter for supervision of other normal pregnancy, unspecified trimester: Secondary | ICD-10-CM | POA: Diagnosis not present

## 2019-07-28 DIAGNOSIS — O99891 Other specified diseases and conditions complicating pregnancy: Secondary | ICD-10-CM

## 2019-07-28 DIAGNOSIS — Z3A24 24 weeks gestation of pregnancy: Secondary | ICD-10-CM

## 2019-07-28 NOTE — Patient Instructions (Signed)
Glucose Tolerance Test During Pregnancy Why am I having this test? The glucose tolerance test (GTT) is done to check how your body processes sugar (glucose). This is one of several tests used to diagnose diabetes that develops during pregnancy (gestational diabetes mellitus). Gestational diabetes is a temporary form of diabetes that some women develop during pregnancy. It usually occurs during the second trimester of pregnancy and goes away after delivery. Testing (screening) for gestational diabetes usually occurs between 24 and 28 weeks of pregnancy. You may have the GTT test after having a 1-hour glucose screening test if the results from that test indicate that you may have gestational diabetes. You may also have this test if:  You have a history of gestational diabetes.  You have a history of giving birth to very large babies or have experienced repeated fetal loss (stillbirth).  You have signs and symptoms of diabetes, such as: ? Changes in your vision. ? Tingling or numbness in your hands or feet. ? Changes in hunger, thirst, and urination that are not otherwise explained by your pregnancy. What is being tested? This test measures the amount of glucose in your blood at different times during a period of 3 hours. This indicates how well your body is able to process glucose. What kind of sample is taken?  Blood samples are required for this test. They are usually collected by inserting a needle into a blood vessel. How do I prepare for this test?  For 3 days before your test, eat normally. Have plenty of carbohydrate-rich foods.  Follow instructions from your health care provider about: ? Eating or drinking restrictions on the day of the test. You may be asked to not eat or drink anything other than water (fast) starting 8-10 hours before the test. ? Changing or stopping your regular medicines. Some medicines may interfere with this test. Tell a health care provider about:  All  medicines you are taking, including vitamins, herbs, eye drops, creams, and over-the-counter medicines.  Any blood disorders you have.  Any surgeries you have had.  Any medical conditions you have. What happens during the test? First, your blood glucose will be measured. This is referred to as your fasting blood glucose, since you fasted before the test. Then, you will drink a glucose solution that contains a certain amount of glucose. Your blood glucose will be measured again 1, 2, and 3 hours after drinking the solution. This test takes about 3 hours to complete. You will need to stay at the testing location during this time. During the testing period:  Do not eat or drink anything other than the glucose solution.  Do not exercise.  Do not use any products that contain nicotine or tobacco, such as cigarettes and e-cigarettes. If you need help stopping, ask your health care provider. The testing procedure may vary among health care providers and hospitals. How are the results reported? Your results will be reported as milligrams of glucose per deciliter of blood (mg/dL) or millimoles per liter (mmol/L). Your health care provider will compare your results to normal ranges that were established after testing a large group of people (reference ranges). Reference ranges may vary among labs and hospitals. For this test, common reference ranges are:  Fasting: less than 95-105 mg/dL (5.3-5.8 mmol/L).  1 hour after drinking glucose: less than 180-190 mg/dL (10.0-10.5 mmol/L).  2 hours after drinking glucose: less than 155-165 mg/dL (8.6-9.2 mmol/L).  3 hours after drinking glucose: 140-145 mg/dL (7.8-8.1 mmol/L). What do the   results mean? Results within reference ranges are considered normal, meaning that your glucose levels are well-controlled. If two or more of your blood glucose levels are high, you may be diagnosed with gestational diabetes. If only one level is high, your health care  provider may suggest repeat testing or other tests to confirm a diagnosis. Talk with your health care provider about what your results mean. Questions to ask your health care provider Ask your health care provider, or the department that is doing the test:  When will my results be ready?  How will I get my results?  What are my treatment options?  What other tests do I need?  What are my next steps? Summary  The glucose tolerance test (GTT) is one of several tests used to diagnose diabetes that develops during pregnancy (gestational diabetes mellitus). Gestational diabetes is a temporary form of diabetes that some women develop during pregnancy.  You may have the GTT test after having a 1-hour glucose screening test if the results from that test indicate that you may have gestational diabetes. You may also have this test if you have any symptoms or risk factors for gestational diabetes.  Talk with your health care provider about what your results mean. This information is not intended to replace advice given to you by your health care provider. Make sure you discuss any questions you have with your health care provider. Document Revised: 08/07/2018 Document Reviewed: 11/26/2016 Elsevier Patient Education  2020 Elsevier Inc.  

## 2019-07-28 NOTE — Progress Notes (Signed)
   PRENATAL VISIT NOTE  Subjective:  Sandra Farrell is a 34 y.o. Sandra Farrell at [redacted]w[redacted]d being seen today for ongoing prenatal care.  She is currently monitored for the following issues for this low-risk pregnancy and has Supervision of other normal pregnancy, antepartum; Sickle cell trait ([place]); GBS bacteriuria; Alpha thalassemia silent carrier; and Uterine fibroid in pregnancy on their problem list.  Patient reports no complaints, but has noticed bulge in upper abdomen..  Contractions: Not present. Vag. Bleeding: None.  Movement: Present. Denies leaking of fluid.   The following portions of the patient's history were reviewed and updated as appropriate: allergies, current medications, past family history, past medical history, past social history, past surgical history and problem list.   Objective:   Vitals:   07/28/19 0834  BP: (!) 105/59  Pulse: 82  Weight: 235 lb 1.9 oz (106.6 kg)    Fetal Status: Fetal Heart Rate (bpm): 140   Movement: Present     General:  Alert, oriented and cooperative. Patient is in no acute distress.  Skin: Skin is warm and dry. No rash noted.   Cardiovascular: Normal heart rate noted  Respiratory: Normal respiratory effort, no problems with respiration noted  Abdomen: Soft, gravid, appropriate for gestational age.  Pain/Pressure: Absent   There is a prominent diastasis recti / borderline ventral hernia measuring about 10-12cm long, and 5-6cm wide, nontender, not incarcerated  Pelvic: Cervical exam deferred        Extremities: Normal range of motion.  Edema: None  Mental Status: Normal mood and affect. Normal behavior. Normal judgment and thought content.   Assessment and Plan:  Pregnancy: Sandra Farrell at [redacted]w[redacted]d 1. Supervision of other normal pregnancy, antepartum      Was told by Dr Nehemiah Settle to do glucola today - Glucose Tolerance, 2 Hours w/1 Hour - RPR - HIV antibody (with reflex) - CBC  2.   Diastasis Recti      Hernia precautions reviewed  Preterm  labor symptoms and general obstetric precautions including but not limited to vaginal bleeding, contractions, leaking of fluid and fetal movement were reviewed in detail with the patient. Please refer to After Visit Summary for other counseling recommendations.   Return in about 4 weeks (around 08/25/2019) for [facility].  Future Appointments  Date Time Provider [place]  08/25/2019  8:10 AM Seabron Spates, CNM [facility] None  09/07/2019  8:15 AM [place] 4 [facility]    Hansel Feinstein, CNM

## 2019-07-29 LAB — CBC
Hematocrit: 29.8 % — ABNORMAL LOW (ref 34.0–46.6)
Hemoglobin: 9.6 g/dL — ABNORMAL LOW (ref 11.1–15.9)
MCH: 26.2 pg — ABNORMAL LOW (ref 26.6–33.0)
MCHC: 32.2 g/dL (ref 31.5–35.7)
MCV: 81 fL (ref 79–97)
Platelets: 203 10*3/uL (ref 150–450)
RBC: 3.66 x10E6/uL — ABNORMAL LOW (ref 3.77–5.28)
RDW: 14.4 % (ref 11.7–15.4)
WBC: 6.3 10*3/uL (ref 3.4–10.8)

## 2019-07-29 LAB — GLUCOSE TOLERANCE, 2 HOURS W/ 1HR
Glucose, 1 hour: 103 mg/dL (ref 65–179)
Glucose, 2 hour: 100 mg/dL (ref 65–152)
Glucose, Fasting: 86 mg/dL (ref 65–91)

## 2019-07-29 LAB — HIV ANTIBODY (ROUTINE TESTING W REFLEX): HIV Screen 4th Generation wRfx: NONREACTIVE

## 2019-07-29 LAB — RPR: RPR Ser Ql: NONREACTIVE

## 2019-08-25 ENCOUNTER — Other Ambulatory Visit: Payer: Self-pay

## 2019-08-25 ENCOUNTER — Encounter: Payer: Self-pay | Admitting: Advanced Practice Midwife

## 2019-08-25 ENCOUNTER — Ambulatory Visit (INDEPENDENT_AMBULATORY_CARE_PROVIDER_SITE_OTHER): Payer: Medicaid Other | Admitting: Advanced Practice Midwife

## 2019-08-25 DIAGNOSIS — Z3480 Encounter for supervision of other normal pregnancy, unspecified trimester: Secondary | ICD-10-CM

## 2019-08-25 NOTE — Patient Instructions (Signed)
Third Trimester of Pregnancy The third trimester is from week 28 through week 40 (months 7 through 9). The third trimester is a time when the unborn baby (fetus) is growing rapidly. At the end of the ninth month, the fetus is about 20 inches in length and weighs 6-10 pounds. Body changes during your third trimester Your body will continue to go through many changes during pregnancy. The changes vary from woman to woman. During the third trimester:  Your weight will continue to increase. You can expect to gain 25-35 pounds (11-16 kg) by the end of the pregnancy.  You may begin to get stretch marks on your hips, abdomen, and breasts.  You may urinate more often because the fetus is moving lower into your pelvis and pressing on your bladder.  You may develop or continue to have heartburn. This is caused by increased hormones that slow down muscles in the digestive tract.  You may develop or continue to have constipation because increased hormones slow digestion and cause the muscles that push waste through your intestines to relax.  You may develop hemorrhoids. These are swollen veins (varicose veins) in the rectum that can itch or be painful.  You may develop swollen, bulging veins (varicose veins) in your legs.  You may have increased body aches in the pelvis, back, or thighs. This is due to weight gain and increased hormones that are relaxing your joints.  You may have changes in your hair. These can include thickening of your hair, rapid growth, and changes in texture. Some women also have hair loss during or after pregnancy, or hair that feels dry or thin. Your hair will most likely return to normal after your baby is born.  Your breasts will continue to grow and they will continue to become tender. A yellow fluid (colostrum) may leak from your breasts. This is the first milk you are producing for your baby.  Your belly button may stick out.  You may notice more swelling in your hands,  face, or ankles.  You may have increased tingling or numbness in your hands, arms, and legs. The skin on your belly may also feel numb.  You may feel short of breath because of your expanding uterus.  You may have more problems sleeping. This can be caused by the size of your belly, increased need to urinate, and an increase in your body's metabolism.  You may notice the fetus "dropping," or moving lower in your abdomen (lightening).  You may have increased vaginal discharge.  You may notice your joints feel loose and you may have pain around your pelvic bone. What to expect at prenatal visits You will have prenatal exams every 2 weeks until week 36. Then you will have weekly prenatal exams. During a routine prenatal visit:  You will be weighed to make sure you and the baby are growing normally.  Your blood pressure will be taken.  Your abdomen will be measured to track your baby's growth.  The fetal heartbeat will be listened to.  Any test results from the previous visit will be discussed.  You may have a cervical check near your due date to see if your cervix has softened or thinned (effaced).  You will be tested for Group B streptococcus. This happens between 35 and 37 weeks. Your health care provider may ask you:  What your birth plan is.  How you are feeling.  If you are feeling the baby move.  If you have had any abnormal   symptoms, such as leaking fluid, bleeding, severe headaches, or abdominal cramping.  If you are using any tobacco products, including cigarettes, chewing tobacco, and electronic cigarettes.  If you have any questions. Other tests or screenings that may be performed during your third trimester include:  Blood tests that check for low iron levels (anemia).  Fetal testing to check the health, activity level, and growth of the fetus. Testing is done if you have certain medical conditions or if there are problems during the pregnancy.  Nonstress test  (NST). This test checks the health of your baby to make sure there are no signs of problems, such as the baby not getting enough oxygen. During this test, a belt is placed around your belly. The baby is made to move, and its heart rate is monitored during movement. What is false labor? False labor is a condition in which you feel small, irregular tightenings of the muscles in the womb (contractions) that usually go away with rest, changing position, or drinking water. These are called Braxton Hicks contractions. Contractions may last for hours, days, or even weeks before true labor sets in. If contractions come at regular intervals, become more frequent, increase in intensity, or become painful, you should see your health care provider. What are the signs of labor?  Abdominal cramps.  Regular contractions that start at 10 minutes apart and become stronger and more frequent with time.  Contractions that start on the top of the uterus and spread down to the lower abdomen and back.  Increased pelvic pressure and dull back pain.  A watery or bloody mucus discharge that comes from the vagina.  Leaking of amniotic fluid. This is also known as your "water breaking." It could be a slow trickle or a gush. Let your health care provider know if it has a color or strange odor. If you have any of these signs, call your health care provider right away, even if it is before your due date. Follow these instructions at home: Medicines  Follow your health care provider's instructions regarding medicine use. Specific medicines may be either safe or unsafe to take during pregnancy.  Take a prenatal vitamin that contains at least 600 micrograms (mcg) of folic acid.  If you develop constipation, try taking a stool softener if your health care provider approves. Eating and drinking   Eat a balanced diet that includes fresh fruits and vegetables, whole grains, good sources of protein such as meat, eggs, or tofu,  and low-fat dairy. Your health care provider will help you determine the amount of weight gain that is right for you.  Avoid raw meat and uncooked cheese. These carry germs that can cause birth defects in the baby.  If you have low calcium intake from food, talk to your health care provider about whether you should take a daily calcium supplement.  Eat four or five small meals rather than three large meals a day.  Limit foods that are high in fat and processed sugars, such as fried and sweet foods.  To prevent constipation: ? Drink enough fluid to keep your urine clear or pale yellow. ? Eat foods that are high in fiber, such as fresh fruits and vegetables, whole grains, and beans. Activity  Exercise only as directed by your health care provider. Most women can continue their usual exercise routine during pregnancy. Try to exercise for 30 minutes at least 5 days a week. Stop exercising if you experience uterine contractions.  Avoid heavy lifting.  Do   not exercise in extreme heat or humidity, or at high altitudes.  Wear low-heel, comfortable shoes.  Practice good posture.  You may continue to have sex unless your health care provider tells you otherwise. Relieving pain and discomfort  Take frequent breaks and rest with your legs elevated if you have leg cramps or low back pain.  Take warm sitz baths to soothe any pain or discomfort caused by hemorrhoids. Use hemorrhoid cream if your health care provider approves.  Wear a good support bra to prevent discomfort from breast tenderness.  If you develop varicose veins: ? Wear support pantyhose or compression stockings as told by your healthcare provider. ? Elevate your feet for 15 minutes, 3-4 times a day. Prenatal care  Write down your questions. Take them to your prenatal visits.  Keep all your prenatal visits as told by your health care provider. This is important. Safety  Wear your seat belt at all times when driving.  Make  a list of emergency phone numbers, including numbers for family, friends, the hospital, and police and fire departments. General instructions  Avoid cat litter boxes and soil used by cats. These carry germs that can cause birth defects in the baby. If you have a cat, ask someone to clean the litter box for you.  Do not travel far distances unless it is absolutely necessary and only with the approval of your health care provider.  Do not use hot tubs, steam rooms, or saunas.  Do not drink alcohol.  Do not use any products that contain nicotine or tobacco, such as cigarettes and e-cigarettes. If you need help quitting, ask your health care provider.  Do not use any medicinal herbs or unprescribed drugs. These chemicals affect the formation and growth of the baby.  Do not douche or use tampons or scented sanitary pads.  Do not cross your legs for long periods of time.  To prepare for the arrival of your baby: ? Take prenatal classes to understand, practice, and ask questions about labor and delivery. ? Make a trial run to the hospital. ? Visit the hospital and tour the maternity area. ? Arrange for maternity or paternity leave through employers. ? Arrange for family and friends to take care of pets while you are in the hospital. ? Purchase a rear-facing car seat and make sure you know how to install it in your car. ? Pack your hospital bag. ? Prepare the baby's nursery. Make sure to remove all pillows and stuffed animals from the baby's crib to prevent suffocation.  Visit your dentist if you have not gone during your pregnancy. Use a soft toothbrush to brush your teeth and be gentle when you floss. Contact a health care provider if:  You are unsure if you are in labor or if your water has broken.  You become dizzy.  You have mild pelvic cramps, pelvic pressure, or nagging pain in your abdominal area.  You have lower back pain.  You have persistent nausea, vomiting, or  diarrhea.  You have an unusual or bad smelling vaginal discharge.  You have pain when you urinate. Get help right away if:  Your water breaks before 37 weeks.  You have regular contractions less than 5 minutes apart before 37 weeks.  You have a fever.  You are leaking fluid from your vagina.  You have spotting or bleeding from your vagina.  You have severe abdominal pain or cramping.  You have rapid weight loss or weight gain.  You have   shortness of breath with chest pain.  You notice sudden or extreme swelling of your face, hands, ankles, feet, or legs.  Your baby makes fewer than 10 movements in 2 hours.  You have severe headaches that do not go away when you take medicine.  You have vision changes. Summary  The third trimester is from week 28 through week 40, months 7 through 9. The third trimester is a time when the unborn baby (fetus) is growing rapidly.  During the third trimester, your discomfort may increase as you and your baby continue to gain weight. You may have abdominal, leg, and back pain, sleeping problems, and an increased need to urinate.  During the third trimester your breasts will keep growing and they will continue to become tender. A yellow fluid (colostrum) may leak from your breasts. This is the first milk you are producing for your baby.  False labor is a condition in which you feel small, irregular tightenings of the muscles in the womb (contractions) that eventually go away. These are called Braxton Hicks contractions. Contractions may last for hours, days, or even weeks before true labor sets in.  Signs of labor can include: abdominal cramps; regular contractions that start at 10 minutes apart and become stronger and more frequent with time; watery or bloody mucus discharge that comes from the vagina; increased pelvic pressure and dull back pain; and leaking of amniotic fluid. This information is not intended to replace advice given to you by your  health care provider. Make sure you discuss any questions you have with your health care provider. Document Revised: 08/07/2018 Document Reviewed: 05/22/2016 Elsevier Patient Education  2020 [employer]  

## 2019-08-25 NOTE — Progress Notes (Signed)
   PRENATAL VISIT NOTE  Subjective:  Sandra Farrell is a 34 y.o. [id number] at [redacted]w[redacted]d being seen today for ongoing prenatal care.  She is currently monitored for the following issues for this low-risk pregnancy and has Supervision of other normal pregnancy, antepartum; Sickle cell trait ([place]); GBS bacteriuria; Alpha thalassemia silent carrier; Uterine fibroid in pregnancy; and Diastasis of rectus abdominis on their problem list.  Patient reports no complaints.  Contractions: Not present. Vag. Bleeding: None.  Movement: Present. Denies leaking of fluid.   The following portions of the patient's history were reviewed and updated as appropriate: allergies, current medications, past family history, past medical history, past social history, past surgical history and problem list.   Objective:   Vitals:   08/25/19 0803  BP: 109/63  Pulse: 91  Weight: 237 lb 1.3 oz (107.5 kg)    Fetal Status: Fetal Heart Rate (bpm): 148   Movement: Present     General:  Alert, oriented and cooperative. Patient is in no acute distress.  Skin: Skin is warm and dry. No rash noted.   Cardiovascular: Normal heart rate noted  Respiratory: Normal respiratory effort, no problems with respiration noted  Abdomen: Soft, gravid, appropriate for gestational age.  Pain/Pressure: Absent     Pelvic: Cervical exam deferred        Extremities: Normal range of motion.  Edema: None  Mental Status: Normal mood and affect. Normal behavior. Normal judgment and thought content.   Assessment and Plan:  Pregnancy: [id number] at [redacted]w[redacted]d 1. Supervision of other normal pregnancy, antepartum      Glucola WNL  Preterm labor symptoms and general obstetric precautions including but not limited to vaginal bleeding, contractions, leaking of fluid and fetal movement were reviewed in detail with the patient. Please refer to After Visit Summary for other counseling recommendations.   Return in about 2 weeks (around 09/08/2019) for [facility].  Future Appointments  Date Time Provider [place]  09/07/2019  8:15 AM [facility] [place] 4 [facility]  09/08/2019  9:50 AM Seabron Spates, CNM [facility] None    Hansel Feinstein, CNM

## 2019-09-07 ENCOUNTER — Other Ambulatory Visit: Payer: Self-pay

## 2019-09-07 ENCOUNTER — Ambulatory Visit (HOSPITAL_COMMUNITY): Payer: Medicaid Other | Attending: Obstetrics and Gynecology

## 2019-09-07 DIAGNOSIS — D259 Leiomyoma of uterus, unspecified: Secondary | ICD-10-CM

## 2019-09-07 DIAGNOSIS — Z3A30 30 weeks gestation of pregnancy: Secondary | ICD-10-CM

## 2019-09-07 DIAGNOSIS — Z362 Encounter for other antenatal screening follow-up: Secondary | ICD-10-CM | POA: Diagnosis not present

## 2019-09-07 DIAGNOSIS — O3413 Maternal care for benign tumor of corpus uteri, third trimester: Secondary | ICD-10-CM | POA: Diagnosis not present

## 2019-09-07 DIAGNOSIS — Z862 Personal history of diseases of the blood and blood-forming organs and certain disorders involving the immune mechanism: Secondary | ICD-10-CM

## 2019-09-07 DIAGNOSIS — O3410 Maternal care for benign tumor of corpus uteri, unspecified trimester: Secondary | ICD-10-CM | POA: Insufficient documentation

## 2019-09-07 DIAGNOSIS — E669 Obesity, unspecified: Secondary | ICD-10-CM | POA: Diagnosis not present

## 2019-09-07 DIAGNOSIS — Z148 Genetic carrier of other disease: Secondary | ICD-10-CM | POA: Diagnosis not present

## 2019-09-07 DIAGNOSIS — O99213 Obesity complicating pregnancy, third trimester: Secondary | ICD-10-CM

## 2019-09-08 ENCOUNTER — Encounter: Payer: Self-pay | Admitting: Advanced Practice Midwife

## 2019-09-08 ENCOUNTER — Ambulatory Visit (INDEPENDENT_AMBULATORY_CARE_PROVIDER_SITE_OTHER): Payer: Medicaid Other | Admitting: Advanced Practice Midwife

## 2019-09-08 VITALS — BP 113/67 | HR 88 | Wt 240.0 lb

## 2019-09-08 DIAGNOSIS — R8271 Bacteriuria: Secondary | ICD-10-CM

## 2019-09-08 DIAGNOSIS — Z3480 Encounter for supervision of other normal pregnancy, unspecified trimester: Secondary | ICD-10-CM

## 2019-09-08 DIAGNOSIS — D563 Thalassemia minor: Secondary | ICD-10-CM

## 2019-09-08 DIAGNOSIS — D259 Leiomyoma of uterus, unspecified: Secondary | ICD-10-CM

## 2019-09-08 DIAGNOSIS — O3410 Maternal care for benign tumor of corpus uteri, unspecified trimester: Secondary | ICD-10-CM

## 2019-09-08 DIAGNOSIS — D573 Sickle-cell trait: Secondary | ICD-10-CM

## 2019-09-08 NOTE — Progress Notes (Signed)
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  She is currently monitored for the following issues for this low-risk pregnancy and has Supervision of other normal pregnancy, antepartum; Sickle cell trait ([place]); GBS bacteriuria; Alpha thalassemia silent carrier; Uterine fibroid in pregnancy; and Diastasis of rectus abdominis on their problem list.  Patient reports no complaints.  Contractions: Not present. Vag. Bleeding: None.  Movement: Present. Denies leaking of fluid.   The following portions of the patient's history were reviewed and updated as appropriate: allergies, current medications, past family history, past medical history, past social history, past surgical history and problem list.   Objective:   Vitals:   09/08/19 1003  BP: 113/67  Pulse: 88  Weight: 240 lb (108.9 kg)    Fetal Status: Fetal Heart Rate (bpm): 140   Movement: Present     General:  Alert, oriented and cooperative. Patient is in no acute distress.  Skin: Skin is warm and dry. No rash noted.   Cardiovascular: Normal heart rate noted  Respiratory: Normal respiratory effort, no problems with respiration noted  Abdomen: Soft, gravid, appropriate for gestational age.  Pain/Pressure: Absent     Pelvic: Cervical exam deferred        Extremities: Normal range of motion.  Edema: None  Mental Status: Normal mood and affect. Normal behavior. Normal judgment and thought content.   Assessment and Plan:  Pregnancy: [id number] at [redacted]w[redacted]d Would like to do Waterbirth.  Did attend the class.  Discussed waterbirth issues including things that might interfere with completing a waterbirth.  Considering Waterbirth? Guide for patients at [facility]  Why consider waterbirth?  . Gentle birth for babies . Less pain medicine used in labor . May allow for passive descent/less pushing . May reduce perineal tears  . More mobility  and instinctive maternal position changes . Increased maternal relaxation . Reduced blood pressure in labor  Is waterbirth safe? What are the risks of infection, drowning or other complications?  . Infection: o Very low risk (3.7 % for tub vs 4.8% for bed) o 7 in 8000 waterbirths with documented infection o Poorly cleaned equipment most common cause o Slightly lower group B strep transmission rate  . Drowning o Maternal:  - Very low risk   - Related to seizures or fainting o Newborn:  - Very low risk. No evidence of increased risk of respiratory problems in multiple large studies - Physiological protection from breathing under water - Avoid underwater birth if there are any fetal complications - Once baby's head is out of the water, keep it out.  . Birth complication o Some reports of cord trauma, but risk decreased by bringing baby to surface gradually o No evidence of increased risk of shoulder dystocia. Mothers can usually change positions faster in water than in a bed, possibly aiding the maneuvers to free the shoulder.   Am I a candidate for waterbirth?  Yes, if you are: . Full-term (37 weeks or greater)  . Have had an uncomplicated pregnancy and labor  No, if you have: Mar[place] Kitchen Preterm birth less than 37 weeks . Thick, particulate meconium stained fluid . Maternal fever over 101 . Heavy bleeding or signs of placental abruption . Pre-eclampsia  . Any abnormal fetal heart rate pattern . Breech presentation . Twins  . Very large baby . Active communicable infection (this does NOT include group B strep) . Significant limitation to  mobility  Please remember that birth is unpredictable. Under certain unforeseeable circumstances your provider may advise against giving birth in the tub. These decisions will be made on a case-by-case basis and with the safety of you and your baby as our highest priority.  Requirements for patients planning waterbirth  . Ask your midwife if you  will be a candidate for waterbirth. . Attend the [facility] [employer] at [phone] or [phone] for dates and times. The class is free and we strongly encourage you to bring your support person. You will receive a certificate of participation to show to your midwife or doctor. . Supplies o Waterbirth tub (NOT kiddie pool) o Architectural technologist pump to inflate tub (manual-foot pump or electric) o New garden hose labeled "lead-free", "suitable for drinking water", "non-toxic" OR "water potable" o Faucet adaptor to attach hose to faucet         o Electric drain pump to remove water (We recommend 792 gallon per hour or greater pump.)  o Water thermometer (baby store / pool supplies) o Occupational psychologist top (optional) o Long-handled mirror (optional)   The above information was reviewed with the patient and she verbally consented and acknowledged the eligibility criteria as well as contraindications and procedures for both labor and birth. The patient also acknowledged that she has been informed that during the course of labor and birth unforeseen conditions may occur which may require her to leave the water. The patients agrees to follow the instructions from the nurse, nurse midwife and/or physician including getting out of the tub if deemed medically necessary.  Preterm labor symptoms and general obstetric precautions including but not limited to vaginal bleeding, contractions, leaking of fluid and fetal movement were reviewed in detail with the patient. Please refer to After Visit Summary for other counseling recommendations.   Return in about 2 weeks (around 09/22/2019) for [facility].  Future Appointments  Date Time Provider [place]  09/15/2019  9:30 AM [facility] NURSE [facility] None  09/22/2019  9:30 AM Seabron Spates, CNM [facility] None  09/29/2019  9:30 AM [facility] NURSE [facility] None    Hansel Feinstein, CNM

## 2019-09-08 NOTE — Patient Instructions (Signed)
Third Trimester of Pregnancy The third trimester is from week 28 through week 40 (months 7 through 9). The third trimester is a time when the unborn baby (fetus) is growing rapidly. At the end of the ninth month, the fetus is about 20 inches in length and weighs 6-10 pounds. Body changes during your third trimester Your body will continue to go through many changes during pregnancy. The changes vary from woman to woman. During the third trimester:  Your weight will continue to increase. You can expect to gain 25-35 pounds (11-16 kg) by the end of the pregnancy.  You may begin to get stretch marks on your hips, abdomen, and breasts.  You may urinate more often because the fetus is moving lower into your pelvis and pressing on your bladder.  You may develop or continue to have heartburn. This is caused by increased hormones that slow down muscles in the digestive tract.  You may develop or continue to have constipation because increased hormones slow digestion and cause the muscles that push waste through your intestines to relax.  You may develop hemorrhoids. These are swollen veins (varicose veins) in the rectum that can itch or be painful.  You may develop swollen, bulging veins (varicose veins) in your legs.  You may have increased body aches in the pelvis, back, or thighs. This is due to weight gain and increased hormones that are relaxing your joints.  You may have changes in your hair. These can include thickening of your hair, rapid growth, and changes in texture. Some women also have hair loss during or after pregnancy, or hair that feels dry or thin. Your hair will most likely return to normal after your baby is born.  Your breasts will continue to grow and they will continue to become tender. A yellow fluid (colostrum) may leak from your breasts. This is the first milk you are producing for your baby.  Your belly button may stick out.  You may notice more swelling in your hands,  face, or ankles.  You may have increased tingling or numbness in your hands, arms, and legs. The skin on your belly may also feel numb.  You may feel short of breath because of your expanding uterus.  You may have more problems sleeping. This can be caused by the size of your belly, increased need to urinate, and an increase in your body's metabolism.  You may notice the fetus "dropping," or moving lower in your abdomen (lightening).  You may have increased vaginal discharge.  You may notice your joints feel loose and you may have pain around your pelvic bone. What to expect at prenatal visits You will have prenatal exams every 2 weeks until week 36. Then you will have weekly prenatal exams. During a routine prenatal visit:  You will be weighed to make sure you and the baby are growing normally.  Your blood pressure will be taken.  Your abdomen will be measured to track your baby's growth.  The fetal heartbeat will be listened to.  Any test results from the previous visit will be discussed.  You may have a cervical check near your due date to see if your cervix has softened or thinned (effaced).  You will be tested for Group B streptococcus. This happens between 35 and 37 weeks. Your health care provider may ask you:  What your birth plan is.  How you are feeling.  If you are feeling the baby move.  If you have had any abnormal   symptoms, such as leaking fluid, bleeding, severe headaches, or abdominal cramping.  If you are using any tobacco products, including cigarettes, chewing tobacco, and electronic cigarettes.  If you have any questions. Other tests or screenings that may be performed during your third trimester include:  Blood tests that check for low iron levels (anemia).  Fetal testing to check the health, activity level, and growth of the fetus. Testing is done if you have certain medical conditions or if there are problems during the pregnancy.  Nonstress test  (NST). This test checks the health of your baby to make sure there are no signs of problems, such as the baby not getting enough oxygen. During this test, a belt is placed around your belly. The baby is made to move, and its heart rate is monitored during movement. What is false labor? False labor is a condition in which you feel small, irregular tightenings of the muscles in the womb (contractions) that usually go away with rest, changing position, or drinking water. These are called Braxton Hicks contractions. Contractions may last for hours, days, or even weeks before true labor sets in. If contractions come at regular intervals, become more frequent, increase in intensity, or become painful, you should see your health care provider. What are the signs of labor?  Abdominal cramps.  Regular contractions that start at 10 minutes apart and become stronger and more frequent with time.  Contractions that start on the top of the uterus and spread down to the lower abdomen and back.  Increased pelvic pressure and dull back pain.  A watery or bloody mucus discharge that comes from the vagina.  Leaking of amniotic fluid. This is also known as your "water breaking." It could be a slow trickle or a gush. Let your health care provider know if it has a color or strange odor. If you have any of these signs, call your health care provider right away, even if it is before your due date. Follow these instructions at home: Medicines  Follow your health care provider's instructions regarding medicine use. Specific medicines may be either safe or unsafe to take during pregnancy.  Take a prenatal vitamin that contains at least 600 micrograms (mcg) of folic acid.  If you develop constipation, try taking a stool softener if your health care provider approves. Eating and drinking   Eat a balanced diet that includes fresh fruits and vegetables, whole grains, good sources of protein such as meat, eggs, or tofu,  and low-fat dairy. Your health care provider will help you determine the amount of weight gain that is right for you.  Avoid raw meat and uncooked cheese. These carry germs that can cause birth defects in the baby.  If you have low calcium intake from food, talk to your health care provider about whether you should take a daily calcium supplement.  Eat four or five small meals rather than three large meals a day.  Limit foods that are high in fat and processed sugars, such as fried and sweet foods.  To prevent constipation: ? Drink enough fluid to keep your urine clear or pale yellow. ? Eat foods that are high in fiber, such as fresh fruits and vegetables, whole grains, and beans. Activity  Exercise only as directed by your health care provider. Most women can continue their usual exercise routine during pregnancy. Try to exercise for 30 minutes at least 5 days a week. Stop exercising if you experience uterine contractions.  Avoid heavy lifting.  Do   not exercise in extreme heat or humidity, or at high altitudes.  Wear low-heel, comfortable shoes.  Practice good posture.  You may continue to have sex unless your health care provider tells you otherwise. Relieving pain and discomfort  Take frequent breaks and rest with your legs elevated if you have leg cramps or low back pain.  Take warm sitz baths to soothe any pain or discomfort caused by hemorrhoids. Use hemorrhoid cream if your health care provider approves.  Wear a good support bra to prevent discomfort from breast tenderness.  If you develop varicose veins: ? Wear support pantyhose or compression stockings as told by your healthcare provider. ? Elevate your feet for 15 minutes, 3-4 times a day. Prenatal care  Write down your questions. Take them to your prenatal visits.  Keep all your prenatal visits as told by your health care provider. This is important. Safety  Wear your seat belt at all times when driving.  Make  a list of emergency phone numbers, including numbers for family, friends, the hospital, and police and fire departments. General instructions  Avoid cat litter boxes and soil used by cats. These carry germs that can cause birth defects in the baby. If you have a cat, ask someone to clean the litter box for you.  Do not travel far distances unless it is absolutely necessary and only with the approval of your health care provider.  Do not use hot tubs, steam rooms, or saunas.  Do not drink alcohol.  Do not use any products that contain nicotine or tobacco, such as cigarettes and e-cigarettes. If you need help quitting, ask your health care provider.  Do not use any medicinal herbs or unprescribed drugs. These chemicals affect the formation and growth of the baby.  Do not douche or use tampons or scented sanitary pads.  Do not cross your legs for long periods of time.  To prepare for the arrival of your baby: ? Take prenatal classes to understand, practice, and ask questions about labor and delivery. ? Make a trial run to the hospital. ? Visit the hospital and tour the maternity area. ? Arrange for maternity or paternity leave through employers. ? Arrange for family and friends to take care of pets while you are in the hospital. ? Purchase a rear-facing car seat and make sure you know how to install it in your car. ? Pack your hospital bag. ? Prepare the baby's nursery. Make sure to remove all pillows and stuffed animals from the baby's crib to prevent suffocation.  Visit your dentist if you have not gone during your pregnancy. Use a soft toothbrush to brush your teeth and be gentle when you floss. Contact a health care provider if:  You are unsure if you are in labor or if your water has broken.  You become dizzy.  You have mild pelvic cramps, pelvic pressure, or nagging pain in your abdominal area.  You have lower back pain.  You have persistent nausea, vomiting, or  diarrhea.  You have an unusual or bad smelling vaginal discharge.  You have pain when you urinate. Get help right away if:  Your water breaks before 37 weeks.  You have regular contractions less than 5 minutes apart before 37 weeks.  You have a fever.  You are leaking fluid from your vagina.  You have spotting or bleeding from your vagina.  You have severe abdominal pain or cramping.  You have rapid weight loss or weight gain.  You have   shortness of breath with chest pain.  You notice sudden or extreme swelling of your face, hands, ankles, feet, or legs.  Your baby makes fewer than 10 movements in 2 hours.  You have severe headaches that do not go away when you take medicine.  You have vision changes. Summary  The third trimester is from week 28 through week 40, months 7 through 9. The third trimester is a time when the unborn baby (fetus) is growing rapidly.  During the third trimester, your discomfort may increase as you and your baby continue to gain weight. You may have abdominal, leg, and back pain, sleeping problems, and an increased need to urinate.  During the third trimester your breasts will keep growing and they will continue to become tender. A yellow fluid (colostrum) may leak from your breasts. This is the first milk you are producing for your baby.  False labor is a condition in which you feel small, irregular tightenings of the muscles in the womb (contractions) that eventually go away. These are called Braxton Hicks contractions. Contractions may last for hours, days, or even weeks before true labor sets in.  Signs of labor can include: abdominal cramps; regular contractions that start at 10 minutes apart and become stronger and more frequent with time; watery or bloody mucus discharge that comes from the vagina; increased pelvic pressure and dull back pain; and leaking of amniotic fluid. This information is not intended to replace advice given to you by your  health care provider. Make sure you discuss any questions you have with your health care provider. Document Revised: 08/07/2018 Document Reviewed: 05/22/2016 Elsevier Patient Education  2020 [employer]  

## 2019-09-15 ENCOUNTER — Other Ambulatory Visit: Payer: Self-pay

## 2019-09-15 ENCOUNTER — Other Ambulatory Visit: Payer: Medicaid Other

## 2019-09-15 NOTE — Progress Notes (Addendum)
Patient here for doppler of fetal heart rate per MFM (arrythmia). Listened for one minute with doppler and heart rate was 130 and had a brief (less than 3 seconds) dip to 120. Patient to return next week for ob visit.  Kathrene Alu RN    Patient was assessed and managed by nursing staff during this encounter. I have reviewed the chart and agree with the documentation and plan. I have also made any necessary editorial changes.  Hansel Feinstein, CNM 09/15/2019 9:01 PM

## 2019-09-17 ENCOUNTER — Other Ambulatory Visit: Payer: Self-pay

## 2019-09-17 ENCOUNTER — Inpatient Hospital Stay (HOSPITAL_COMMUNITY)
Admission: AD | Admit: 2019-09-17 | Discharge: 2019-09-18 | Disposition: A | Discharge status: Home / Self Care | Payer: Medicaid Other | Attending: Obstetrics & Gynecology | Admitting: Obstetrics & Gynecology

## 2019-09-17 ENCOUNTER — Encounter (HOSPITAL_COMMUNITY): Payer: Self-pay | Admitting: Obstetrics & Gynecology

## 2019-09-17 DIAGNOSIS — R55 Syncope and collapse: Secondary | ICD-10-CM

## 2019-09-17 DIAGNOSIS — Z3A34 34 weeks gestation of pregnancy: Secondary | ICD-10-CM | POA: Diagnosis not present

## 2019-09-17 DIAGNOSIS — O99891 Other specified diseases and conditions complicating pregnancy: Secondary | ICD-10-CM

## 2019-09-17 DIAGNOSIS — O26893 Other specified pregnancy related conditions, third trimester: Secondary | ICD-10-CM | POA: Insufficient documentation

## 2019-09-17 DIAGNOSIS — R42 Dizziness and giddiness: Secondary | ICD-10-CM | POA: Diagnosis not present

## 2019-09-17 DIAGNOSIS — O99353 Diseases of the nervous system complicating pregnancy, third trimester: Secondary | ICD-10-CM

## 2019-09-17 DIAGNOSIS — Z3A31 31 weeks gestation of pregnancy: Secondary | ICD-10-CM | POA: Insufficient documentation

## 2019-09-17 DIAGNOSIS — G43909 Migraine, unspecified, not intractable, without status migrainosus: Secondary | ICD-10-CM

## 2019-09-17 DIAGNOSIS — D259 Leiomyoma of uterus, unspecified: Secondary | ICD-10-CM

## 2019-09-17 HISTORY — DX: Benign neoplasm of connective and other soft tissue, unspecified: D21.9

## 2019-09-17 LAB — URINALYSIS, ROUTINE W REFLEX MICROSCOPIC
Bilirubin Urine: NEGATIVE
Glucose, UA: NEGATIVE mg/dL
Hgb urine dipstick: NEGATIVE
Ketones, ur: NEGATIVE mg/dL
Leukocytes,Ua: NEGATIVE
Nitrite: NEGATIVE
Protein, ur: NEGATIVE mg/dL
Specific Gravity, Urine: 1.008 (ref 1.005–1.030)
pH: 7 (ref 5.0–8.0)

## 2019-09-17 LAB — CBC WITH DIFFERENTIAL/PLATELET
Abs Immature Granulocytes: 0.05 10*3/uL (ref 0.00–0.07)
Basophils Absolute: 0 10*3/uL (ref 0.0–0.1)
Basophils Relative: 0 %
Eosinophils Absolute: 0 10*3/uL (ref 0.0–0.5)
Eosinophils Relative: 1 %
HCT: 29.8 % — ABNORMAL LOW (ref 36.0–46.0)
Hemoglobin: 9.4 g/dL — ABNORMAL LOW (ref 12.0–15.0)
Immature Granulocytes: 1 %
Lymphocytes Relative: 19 %
Lymphs Abs: 1.6 10*3/uL (ref 0.7–4.0)
MCH: 25.4 pg — ABNORMAL LOW (ref 26.0–34.0)
MCHC: 31.5 g/dL (ref 30.0–36.0)
MCV: 80.5 fL (ref 80.0–100.0)
Monocytes Absolute: 0.5 10*3/uL (ref 0.1–1.0)
Monocytes Relative: 5 %
Neutro Abs: 6.3 10*3/uL (ref 1.7–7.7)
Neutrophils Relative %: 74 %
Platelets: 235 10*3/uL (ref 150–400)
RBC: 3.7 MIL/uL — ABNORMAL LOW (ref 3.87–5.11)
RDW: 14.6 % (ref 11.5–15.5)
WBC: 8.5 10*3/uL (ref 4.0–10.5)
nRBC: 0 % (ref 0.0–0.2)

## 2019-09-17 MED ORDER — DEXAMETHASONE 6 MG PO TABS
6.0000 mg | ORAL_TABLET | Freq: Once | ORAL | Status: AC
  Administered 2019-09-18: 6 mg via ORAL
  Filled 2019-09-17: qty 1

## 2019-09-17 MED ORDER — METOCLOPRAMIDE HCL 10 MG PO TABS
10.0000 mg | ORAL_TABLET | Freq: Once | ORAL | Status: AC
  Administered 2019-09-17: 10 mg via ORAL
  Filled 2019-09-17: qty 1

## 2019-09-17 MED ORDER — DIPHENHYDRAMINE HCL 25 MG PO CAPS
25.0000 mg | ORAL_CAPSULE | Freq: Once | ORAL | Status: AC
  Administered 2019-09-17: 25 mg via ORAL
  Filled 2019-09-17: qty 1

## 2019-09-17 NOTE — MAU Provider Note (Signed)
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  Started with feeling of room spinning, so she sat down.  Now has a new headache, which shoots into her right eye.  States is not really short of breath.  Has felt like the baby is in her ribs for several months now.  No dyspnea. .No dizziness now.  She reports good fetal movement, denies LOF, vaginal bleeding, vaginal itching/burning, urinary symptoms, h/a, n/v, diarrhea, constipation or fever/chills.  She denies headache, visual changes or RUQ abdominal pain.  Near Syncope This is a new problem. The current episode started today. The problem has been resolved. Associated symptoms include vertigo (just before event but not now). Pertinent negatives include no chills, congestion, coughing, fatigue, myalgias, nausea, numbness, urinary symptoms, visual change or weakness. Nothing aggravates the symptoms. She has tried rest for the symptoms. The treatment provided moderate relief.  Headache  This is a new problem. The current episode started today. The problem occurs constantly. The problem has been unchanged. The pain is located in the frontal region. Radiates to: right eye. The pain quality is not similar to prior headaches. The quality of the pain is described as aching. Pertinent negatives include no back pain, blurred vision, coughing, loss of balance, nausea, numbness, photophobia, sinus pressure, tingling, visual change or weakness. Nothing aggravates the symptoms. She has tried nothing for the symptoms.    RN Note:  fainted about 27. Was braiding hair and standing in one place. Room was starting to spin and I sat down and next thing I remember was someone rubbing ice on my back. I did not fall.  Call after hours line and told me to drink  oj. I still feel weird like a ran a marathon. Denies VB or LOF. Have a h/a now but no other pain. Feels SOB due to baby "being up in my ribs"  Past Medical History: Past Medical History:  Diagnosis Date  . Fibroid   . Vaginal Pap smear, abnormal     Past obstetric history: OB History  Gravida Para Term Preterm AB Living  4 2 2  0 1 2  SAB TAB Ectopic Multiple Live Births  0 1 0 0 2    # Outcome Date GA Lbr Len/2nd Weight Sex Delivery Anes PTL Lv  4 Current           3 TAB 2018          2 Term 02/22/06 [redacted]w[redacted]d  3997 g M    LIV  1 Term 12/14/03 [redacted]w[redacted]d  3799 g M Vag-Spont   LIV    Past Surgical History: Past Surgical History:  Procedure Laterality Date  . foot surgery right    . INDUCED ABORTION      Family History: Family History  Problem Relation Age of Onset  . Hypertension Father   . Asthma Son   . Cancer Neg Hx   . Depression Neg Hx   . Stroke Neg Hx     Social History: Social History   Tobacco Use  . Smoking status: Never Smoker  . Smokeless tobacco: Never Used  Substance Use Topics  . Alcohol use: Not Currently    Comment: not in pregnancy  . Drug use: No    Allergies: No Known Allergies  Meds:  Medications  Prior to Admission  Medication Sig Dispense Refill Last Dose  . Prenatal Vit-Fe Fumarate-FA (MULTIVITAMIN-PRENATAL) 27-0.8 MG TABS tablet Take 1 tablet by mouth daily at 12 noon.   09/17/2019 at Unknown time  . [facility] SLEEP AID NIGHTTIME 25 MG tablet TAKE 1 TABLET (25 MG TOTAL) BY MOUTH 4 (FOUR) TIMES DAILY AS NEEDED (NAUSEA AND VOMITING).     Marland Kitchen ondansetron (ZOFRAN ODT) 4 MG disintegrating tablet Take 1 tablet (4 mg total) by mouth every 6 (six) hours as needed for nausea. (Patient not taking: Reported on 07/28/2019) 30 tablet 4   . vitamin B-6 (PYRIDOXINE) 25 MG tablet Take 1 tablet (25 mg total) by mouth every 6 (six) hours. (Patient not taking: Reported on 07/28/2019) 180 tablet 3     I have reviewed patient's Past Medical Hx, Surgical Hx, Family Hx,  Social Hx, medications and allergies.   ROS:  Review of Systems  Constitutional: Negative for chills and fatigue.  HENT: Negative for congestion and sinus pressure.   Eyes: Negative for blurred vision and photophobia.  Respiratory: Negative for cough.   Cardiovascular: Positive for near-syncope.  Gastrointestinal: Negative for nausea.  Musculoskeletal: Negative for back pain and myalgias.  Neurological: Positive for vertigo (just before event but not now). Negative for tingling, weakness, numbness and loss of balance.   Other systems negative  Physical Exam   Patient Vitals for the past 24 hrs:  BP Temp Pulse Resp SpO2  09/17/19 2237 132/81 98.2 F (36.8 C) 90 16 99 %   Constitutional: Well-developed, well-nourished female in no acute distress.  Cardiovascular: normal rate and rhythm Respiratory: normal effort, clear to auscultation bilaterally GI: Abd soft, non-tender, gravid appropriate for gestational age.   No rebound or guarding. MS: Extremities nontender, no edema, normal ROM Neurologic: Alert and oriented x 4.  GU: Neg CVAT.  PELVIC EXAM: deferred  FHT:  Baseline 140 , moderate variability, accelerations present, no decelerations Contractions: occasional    Labs: Results for orders placed or performed during the hospital encounter of 09/17/19 (from the past 24 hour(s))  Urinalysis, Routine w reflex microscopic     Status: Abnormal   Collection Time: 09/17/19 10:43 PM  Result Value Ref Range   Color, Urine STRAW (A) YELLOW   APPearance CLEAR CLEAR   Specific Gravity, Urine 1.008 1.005 - 1.030   pH 7.0 5.0 - 8.0   Glucose, UA NEGATIVE NEGATIVE mg/dL   Hgb urine dipstick NEGATIVE NEGATIVE   Bilirubin Urine NEGATIVE NEGATIVE   Ketones, ur NEGATIVE NEGATIVE mg/dL   Protein, ur NEGATIVE NEGATIVE mg/dL   Nitrite NEGATIVE NEGATIVE   Leukocytes,Ua NEGATIVE NEGATIVE  CBC with Differential/Platelet     Status: Abnormal   Collection Time: 09/17/19 11:10 PM  Result  Value Ref Range   WBC 8.5 4.0 - 10.5 K/uL   RBC 3.70 (L) 3.87 - 5.11 MIL/uL   Hemoglobin 9.4 (L) 12.0 - 15.0 g/dL   HCT 29.8 (L) 36.0 - 46.0 %   MCV 80.5 80.0 - 100.0 fL   MCH 25.4 (L) 26.0 - 34.0 pg   MCHC 31.5 30.0 - 36.0 g/dL   RDW 14.6 11.5 - 15.5 %   Platelets 235 150 - 400 K/uL   nRBC 0.0 0.0 - 0.2 %   Neutrophils Relative % 74 %   Neutro Abs 6.3 1.7 - 7.7 K/uL   Lymphocytes Relative 19 %   Lymphs Abs 1.6 0.7 - 4.0 K/uL   Monocytes Relative 5 %   Monocytes Absolute 0.5 0.1 -  1.0 K/uL   Eosinophils Relative 1 %   Eosinophils Absolute 0.0 0.0 - 0.5 K/uL   Basophils Relative 0 %   Basophils Absolute 0.0 0.0 - 0.1 K/uL   Immature Granulocytes 1 %   Abs Immature Granulocytes 0.05 0.00 - 0.07 K/uL  Comprehensive metabolic panel     Status: Abnormal   Collection Time: 09/17/19 11:10 PM  Result Value Ref Range   Sodium 136 135 - 145 mmol/L   Potassium 4.1 3.5 - 5.1 mmol/L   Chloride 105 98 - 111 mmol/L   CO2 24 22 - 32 mmol/L   Glucose, Bld 161 (H) 70 - 99 mg/dL   BUN <5 (L) 6 - 20 mg/dL   Creatinine, Ser 0.66 0.44 - 1.00 mg/dL   Calcium 8.8 (L) 8.9 - 10.3 mg/dL   Total Protein 6.3 (L) 6.5 - 8.1 g/dL   Albumin 2.7 (L) 3.5 - 5.0 g/dL   AST 28 15 - 41 U/L   ALT 23 0 - 44 U/L   Alkaline Phosphatase 85 38 - 126 U/L   Total Bilirubin 0.6 0.3 - 1.2 mg/dL   GFR calc non Af Amer >60 >60 mL/min   GFR calc Af Amer >60 >60 mL/min   Anion gap 7 5 - 15    O/Positive/-- (12/30 1510)  Imaging:  12 lead EKG:  NSR  MAU Course/MDM: I have ordered labs and reviewed results. These are normal EKG done and is normal sinus rhythm NST reviewed and is reactive, category I  Treatments in MAU included Reglan, Benadryl and Decadron (all PO) This treatment completely relieved headache We did orthostatic vital signs which were normal  Did not experience any vertigo while changing positions.  Discussed the near syncope episode seemed to be related to Vertigo which resolved with sitting The  unusual headache was entirely relieved with Migraine cocktail, so it may have been an atypical migraine.  There is no real shortness of breath, just awareness of baby below ribs.  Oxygen sats 99%, lungs clear and EKG normal .    Assessment: Single intrauterine pregnancy at [redacted]w[redacted]d Near syncope preceded by Vertigo Both now resolved Migraine, possibly atypical, relieved  Plan: Discharge home Follow up in Office for prenatal visit Tuesday with me Monitor for any recurrence of symptoms.  If she has any headache, vertigo or other symptoms come back right away.  Encouraged to return here or to other Urgent Care/ED if she develops worsening of symptoms, increase in pain, fever, or other concerning symptoms.   Pt stable at time of discharge.  Hansel Feinstein CNM, MSN Certified Nurse-Midwife 09/17/2019 11:04 PM

## 2019-09-17 NOTE — MAU Note (Addendum)
I fainted about 1930. Was braiding hair and standing in one place. Room was starting to spin and I sat down and next thing I remember was someone rubbing ice on my back. I did not fall.  Call after hours line and told me to drink oj. I still feel weird like a ran a marathon. Denies VB or LOF. Have a h/a now but no other pain. Feels SOB due to baby "being up in my ribs"

## 2019-09-18 LAB — COMPREHENSIVE METABOLIC PANEL
ALT: 23 U/L (ref 0–44)
AST: 28 U/L (ref 15–41)
Albumin: 2.7 g/dL — ABNORMAL LOW (ref 3.5–5.0)
Alkaline Phosphatase: 85 U/L (ref 38–126)
Anion gap: 7 (ref 5–15)
BUN: <5 mg/dL — ABNORMAL LOW (ref 6–20)
CO2: 24 mmol/L (ref 22–32)
Calcium: 8.8 mg/dL — ABNORMAL LOW (ref 8.9–10.3)
Chloride: 105 mmol/L (ref 98–111)
Creatinine, Ser: 0.66 mg/dL (ref 0.44–1.00)
GFR calc Af Amer: >60 mL/min (ref >60)
GFR calc non Af Amer: >60 mL/min (ref >60)
Glucose, Bld: 161 mg/dL — ABNORMAL HIGH (ref 70–99)
Potassium: 4.1 mmol/L (ref 3.5–5.1)
Sodium: 136 mmol/L (ref 135–145)
Total Bilirubin: 0.6 mg/dL (ref 0.3–1.2)
Total Protein: 6.3 g/dL — ABNORMAL LOW (ref 6.5–8.1)

## 2019-09-18 NOTE — Discharge Instructions (Signed)
Dizziness Dizziness is a common problem. It is a feeling of unsteadiness or light-headedness. You may feel like you are about to faint. Dizziness can lead to injury if you stumble or fall. Anyone can become dizzy, but dizziness is more common in older adults. This condition can be caused by a number of things, including medicines, dehydration, or illness. Follow these instructions at home: Eating and drinking  Drink enough fluid to keep your urine clear or pale yellow. This helps to keep you from becoming dehydrated. Try to drink more clear fluids, such as water.  Do not drink alcohol.  Limit your caffeine intake if told to do so by your health care provider. Check ingredients and nutrition facts to see if a food or beverage contains caffeine.  Limit your salt (sodium) intake if told to do so by your health care provider. Check ingredients and nutrition facts to see if a food or beverage contains sodium. Activity  Avoid making quick movements. ? Rise slowly from chairs and steady yourself until you feel okay. ? In the morning, first sit up on the side of the bed. When you feel okay, stand slowly while you hold onto something until you know that your balance is fine.  If you need to stand in one place for a long time, move your legs often. Tighten and relax the muscles in your legs while you are standing.  Do not drive or use heavy machinery if you feel dizzy.  Avoid bending down if you feel dizzy. Place items in your home so that they are easy for you to reach without leaning over. Lifestyle  Do not use any products that contain nicotine or tobacco, such as cigarettes and e-cigarettes. If you need help quitting, ask your health care provider.  Try to reduce your stress level by using methods such as yoga or meditation. Talk with your health care provider if you need help to manage your stress. General instructions  Watch your dizziness for any changes.  Take over-the-counter and  prescription medicines only as told by your health care provider. Talk with your health care provider if you think that your dizziness is caused by a medicine that you are taking.  Tell a friend or a family member that you are feeling dizzy. If he or she notices any changes in your behavior, have this person call your health care provider.  Keep all follow-up visits as told by your health care provider. This is important. Contact a health care provider if:  Your dizziness does not go away.  Your dizziness or light-headedness gets worse.  You feel nauseous.  You have reduced hearing.  You have new symptoms.  You are unsteady on your feet or you feel like the room is spinning. Get help right away if:  You vomit or have diarrhea and are unable to eat or drink anything.  You have problems talking, walking, swallowing, or using your arms, hands, or legs.  You feel generally weak.  You are not thinking clearly or you have trouble forming sentences. It may take a friend or family member to notice this.  You have chest pain, abdominal pain, shortness of breath, or sweating.  Your vision changes.  You have any bleeding.  You have a severe headache.  You have neck pain or a stiff neck.  You have a fever. These symptoms may represent a serious problem that is an emergency. Do not wait to see if the symptoms will go away. Get medical help   right away. Call your local emergency services (911 in the [place] Do not drive yourself to the hospital. Summary  Dizziness is a feeling of unsteadiness or light-headedness. This condition can be caused by a number of things, including medicines, dehydration, or illness.  Anyone can become dizzy, but dizziness is more common in older adults.  Drink enough fluid to keep your urine clear or pale yellow. Do not drink alcohol.  Avoid making quick movements if you feel dizzy. Monitor your dizziness for any changes. This information is not intended to  replace advice given to you by your health care provider. Make sure you discuss any questions you have with your health care provider. Document Revised: 04/19/2017 Document Reviewed: 05/19/2016 Elsevier Patient Education  2020 Else[employer] Inc[employer]  Migraine Headache A migraine headache is an intense, throbbing pain on one side or both sides of the head. Migraine headaches may also cause other symptoms, such as nausea, vomiting, and sensitivity to light and noise. A migraine headache can last from 4 hours to 3 days. Talk with your doctor about what things may bring on (trigger) your migraine headaches. What are the causes? The exact cause of this condition is not known. However, a migraine may be caused when nerves in the brain become irritated and release chemicals that cause inflammation of blood vessels. This inflammation causes pain. This condition may be triggered or caused by:  Drinking alcohol.  Smoking.  Taking medicines, such as: ? Medicine used to treat chest pain (nitroglycerin). ? Birth control pills. ? Estrogen. ? Certain blood pressure medicines.  Eating or drinking products that contain nitrates, glutamate, aspartame, or tyramine. Aged cheeses, chocolate, or caffeine may also be triggers.  Doing physical activity. Other things that may trigger a migraine headache include:  Menstruation.  Pregnancy.  Hunger.  Stress.  Lack of sleep or too much sleep.  Weather changes.  Fatigue. What increases the risk? The following factors may make you more likely to experience migraine headaches:  Being a certain age. This condition is more common in people who are 25-55 years old.  Being female.  Having a family history of migraine headaches.  Being Caucasian.  Having a mental health condition, such as depression or anxiety.  Being obese. What are the signs or symptoms? The main symptom of this condition is pulsating or throbbing pain. This pain may:  Happen in any area  of the head, such as on one side or both sides.  Interfere with daily activities.  Get worse with physical activity.  Get worse with exposure to bright lights or loud noises. Other symptoms may include:  Nausea.  Vomiting.  Dizziness.  General sensitivity to bright lights, loud noises, or smells. Before you get a migraine headache, you may get warning signs (an aura). An aura may include:  Seeing flashing lights or having blind spots.  Seeing bright spots, halos, or zigzag lines.  Having tunnel vision or blurred vision.  Having numbness or a tingling feeling.  Having trouble talking.  Having muscle weakness. Some people have symptoms after a migraine headache (postdromal phase), such as:  Feeling tired.  Difficulty concentrating. How is this diagnosed? A migraine headache can be diagnosed based on:  Your symptoms.  A physical exam.  Tests, such as: ? CT scan or an MRI of the head. These imaging tests can help rule out other causes of headaches. ? Taking fluid from the spine (lumbar puncture) and analyzing it (cerebrospinal fluid analysis, or CSF analysis). How is this treated?   This condition may be treated with medicines that:  Relieve pain.  Relieve nausea.  Prevent migraine headaches. Treatment for this condition may also include:  Acupuncture.  Lifestyle changes like avoiding foods that trigger migraine headaches.  Biofeedback.  Cognitive behavioral therapy. Follow these instructions at home: Medicines  Take over-the-counter and prescription medicines only as told by your health care provider.  Ask your health care provider if the medicine prescribed to you: ? Requires you to avoid driving or using heavy machinery. ? Can cause constipation. You may need to take these actions to prevent or treat constipation:  Drink enough fluid to keep your urine pale yellow.  Take over-the-counter or prescription medicines.  Eat foods that are high in  fiber, such as beans, whole grains, and fresh fruits and vegetables.  Limit foods that are high in fat and processed sugars, such as fried or sweet foods. Lifestyle  Do not drink alcohol.  Do not use any products that contain nicotine or tobacco, such as cigarettes, e-cigarettes, and chewing tobacco. If you need help quitting, ask your health care provider.  Get at least 8 hours of sleep every night.  Find ways to manage stress, such as meditation, deep breathing, or yoga. General instructions      Keep a journal to find out what may trigger your migraine headaches. For example, write down: ? What you eat and drink. ? How much sleep you get. ? Any change to your diet or medicines.  If you have a migraine headache: ? Avoid things that make your symptoms worse, such as bright lights. ? It may help to lie down in a dark, quiet room. ? Do not drive or use heavy machinery. ? Ask your health care provider what activities are safe for you while you are experiencing symptoms.  Keep all follow-up visits as told by your health care provider. This is important. Contact a health care provider if:  You develop symptoms that are different or more severe than your usual migraine headache symptoms.  You have more than 15 headache days in one month. Get help right away if:  Your migraine headache becomes severe.  Your migraine headache lasts longer than 72 hours.  You have a fever.  You have a stiff neck.  You have vision loss.  Your muscles feel weak or like you cannot control them.  You start to lose your balance often.  You have trouble walking.  You faint.  You have a seizure. Summary  A migraine headache is an intense, throbbing pain on one side or both sides of the head. Migraines may also cause other symptoms, such as nausea, vomiting, and sensitivity to light and noise.  This condition may be treated with medicines and lifestyle changes. You may also need to avoid  certain things that trigger a migraine headache.  Keep a journal to find out what may trigger your migraine headaches.  Contact your health care provider if you have more than 15 headache days in a month or you develop symptoms that are different or more severe than your usual migraine headache symptoms. This information is not intended to replace advice given to you by your health care provider. Make sure you discuss any questions you have with your health care provider. Document Revised: 08/08/2018 Document Reviewed: 05/29/2018 Elsevier Patient Education  2020 Elsevier Inc.  

## 2019-09-18 NOTE — Progress Notes (Signed)
Marie Williams CNM in earlier to discuss test results and d/c plan. Written and verbal d/c instructions given and understanding voiced °

## 2019-09-18 NOTE — Progress Notes (Signed)
OK per Hansel Feinstein CNM to d/c EFM. EKG completed at bedside and pt then up to BR

## 2019-09-22 ENCOUNTER — Ambulatory Visit (INDEPENDENT_AMBULATORY_CARE_PROVIDER_SITE_OTHER): Payer: Medicaid Other | Admitting: Advanced Practice Midwife

## 2019-09-22 ENCOUNTER — Other Ambulatory Visit: Payer: Self-pay

## 2019-09-22 ENCOUNTER — Encounter: Payer: Self-pay | Admitting: Advanced Practice Midwife

## 2019-09-22 VITALS — BP 112/75 | HR 99 | Wt 241.0 lb

## 2019-09-22 DIAGNOSIS — R519 Headache, unspecified: Secondary | ICD-10-CM

## 2019-09-22 DIAGNOSIS — O26899 Other specified pregnancy related conditions, unspecified trimester: Secondary | ICD-10-CM

## 2019-09-22 DIAGNOSIS — R42 Dizziness and giddiness: Secondary | ICD-10-CM

## 2019-09-22 DIAGNOSIS — Z3480 Encounter for supervision of other normal pregnancy, unspecified trimester: Secondary | ICD-10-CM

## 2019-09-22 DIAGNOSIS — Z3A32 32 weeks gestation of pregnancy: Secondary | ICD-10-CM

## 2019-09-22 MED ORDER — METOCLOPRAMIDE HCL 5 MG PO TABS: 5.0000 mg | ORAL_TABLET | Freq: Four times a day (QID) | ORAL | 0 refills | Status: DC | PRN

## 2019-09-22 MED ORDER — PSEUDOEPHEDRINE HCL 60 MG PO TABS: 60.0000 mg | ORAL_TABLET | Freq: Four times a day (QID) | ORAL | 0 refills | Status: DC | PRN

## 2019-09-22 NOTE — Patient Instructions (Signed)
Third Trimester of Pregnancy The third trimester is from week 28 through week 40 (months 7 through 9). The third trimester is a time when the unborn baby (fetus) is growing rapidly. At the end of the ninth month, the fetus is about 20 inches in length and weighs 6-10 pounds. Body changes during your third trimester Your body will continue to go through many changes during pregnancy. The changes vary from woman to woman. During the third trimester:  Your weight will continue to increase. You can expect to gain 25-35 pounds (11-16 kg) by the end of the pregnancy.  You may begin to get stretch marks on your hips, abdomen, and breasts.  You may urinate more often because the fetus is moving lower into your pelvis and pressing on your bladder.  You may develop or continue to have heartburn. This is caused by increased hormones that slow down muscles in the digestive tract.  You may develop or continue to have constipation because increased hormones slow digestion and cause the muscles that push waste through your intestines to relax.  You may develop hemorrhoids. These are swollen veins (varicose veins) in the rectum that can itch or be painful.  You may develop swollen, bulging veins (varicose veins) in your legs.  You may have increased body aches in the pelvis, back, or thighs. This is due to weight gain and increased hormones that are relaxing your joints.  You may have changes in your hair. These can include thickening of your hair, rapid growth, and changes in texture. Some women also have hair loss during or after pregnancy, or hair that feels dry or thin. Your hair will most likely return to normal after your baby is born.  Your breasts will continue to grow and they will continue to become tender. A yellow fluid (colostrum) may leak from your breasts. This is the first milk you are producing for your baby.  Your belly button may stick out.  You may notice more swelling in your hands,  face, or ankles.  You may have increased tingling or numbness in your hands, arms, and legs. The skin on your belly may also feel numb.  You may feel short of breath because of your expanding uterus.  You may have more problems sleeping. This can be caused by the size of your belly, increased need to urinate, and an increase in your body's metabolism.  You may notice the fetus "dropping," or moving lower in your abdomen (lightening).  You may have increased vaginal discharge.  You may notice your joints feel loose and you may have pain around your pelvic bone. What to expect at prenatal visits You will have prenatal exams every 2 weeks until week 36. Then you will have weekly prenatal exams. During a routine prenatal visit:  You will be weighed to make sure you and the baby are growing normally.  Your blood pressure will be taken.  Your abdomen will be measured to track your baby's growth.  The fetal heartbeat will be listened to.  Any test results from the previous visit will be discussed.  You may have a cervical check near your due date to see if your cervix has softened or thinned (effaced).  You will be tested for Group B streptococcus. This happens between 35 and 37 weeks. Your health care provider may ask you:  What your birth plan is.  How you are feeling.  If you are feeling the baby move.  If you have had any abnormal   symptoms, such as leaking fluid, bleeding, severe headaches, or abdominal cramping.  If you are using any tobacco products, including cigarettes, chewing tobacco, and electronic cigarettes.  If you have any questions. Other tests or screenings that may be performed during your third trimester include:  Blood tests that check for low iron levels (anemia).  Fetal testing to check the health, activity level, and growth of the fetus. Testing is done if you have certain medical conditions or if there are problems during the pregnancy.  Nonstress test  (NST). This test checks the health of your baby to make sure there are no signs of problems, such as the baby not getting enough oxygen. During this test, a belt is placed around your belly. The baby is made to move, and its heart rate is monitored during movement. What is false labor? False labor is a condition in which you feel small, irregular tightenings of the muscles in the womb (contractions) that usually go away with rest, changing position, or drinking water. These are called Braxton Hicks contractions. Contractions may last for hours, days, or even weeks before true labor sets in. If contractions come at regular intervals, become more frequent, increase in intensity, or become painful, you should see your health care provider. What are the signs of labor?  Abdominal cramps.  Regular contractions that start at 10 minutes apart and become stronger and more frequent with time.  Contractions that start on the top of the uterus and spread down to the lower abdomen and back.  Increased pelvic pressure and dull back pain.  A watery or bloody mucus discharge that comes from the vagina.  Leaking of amniotic fluid. This is also known as your "water breaking." It could be a slow trickle or a gush. Let your health care provider know if it has a color or strange odor. If you have any of these signs, call your health care provider right away, even if it is before your due date. Follow these instructions at home: Medicines  Follow your health care provider's instructions regarding medicine use. Specific medicines may be either safe or unsafe to take during pregnancy.  Take a prenatal vitamin that contains at least 600 micrograms (mcg) of folic acid.  If you develop constipation, try taking a stool softener if your health care provider approves. Eating and drinking   Eat a balanced diet that includes fresh fruits and vegetables, whole grains, good sources of protein such as meat, eggs, or tofu,  and low-fat dairy. Your health care provider will help you determine the amount of weight gain that is right for you.  Avoid raw meat and uncooked cheese. These carry germs that can cause birth defects in the baby.  If you have low calcium intake from food, talk to your health care provider about whether you should take a daily calcium supplement.  Eat four or five small meals rather than three large meals a day.  Limit foods that are high in fat and processed sugars, such as fried and sweet foods.  To prevent constipation: ? Drink enough fluid to keep your urine clear or pale yellow. ? Eat foods that are high in fiber, such as fresh fruits and vegetables, whole grains, and beans. Activity  Exercise only as directed by your health care provider. Most women can continue their usual exercise routine during pregnancy. Try to exercise for 30 minutes at least 5 days a week. Stop exercising if you experience uterine contractions.  Avoid heavy lifting.  Do   not exercise in extreme heat or humidity, or at high altitudes.  Wear low-heel, comfortable shoes.  Practice good posture.  You may continue to have sex unless your health care provider tells you otherwise. Relieving pain and discomfort  Take frequent breaks and rest with your legs elevated if you have leg cramps or low back pain.  Take warm sitz baths to soothe any pain or discomfort caused by hemorrhoids. Use hemorrhoid cream if your health care provider approves.  Wear a good support bra to prevent discomfort from breast tenderness.  If you develop varicose veins: ? Wear support pantyhose or compression stockings as told by your healthcare provider. ? Elevate your feet for 15 minutes, 3-4 times a day. Prenatal care  Write down your questions. Take them to your prenatal visits.  Keep all your prenatal visits as told by your health care provider. This is important. Safety  Wear your seat belt at all times when driving.  Make  a list of emergency phone numbers, including numbers for family, friends, the hospital, and police and fire departments. General instructions  Avoid cat litter boxes and soil used by cats. These carry germs that can cause birth defects in the baby. If you have a cat, ask someone to clean the litter box for you.  Do not travel far distances unless it is absolutely necessary and only with the approval of your health care provider.  Do not use hot tubs, steam rooms, or saunas.  Do not drink alcohol.  Do not use any products that contain nicotine or tobacco, such as cigarettes and e-cigarettes. If you need help quitting, ask your health care provider.  Do not use any medicinal herbs or unprescribed drugs. These chemicals affect the formation and growth of the baby.  Do not douche or use tampons or scented sanitary pads.  Do not cross your legs for long periods of time.  To prepare for the arrival of your baby: ? Take prenatal classes to understand, practice, and ask questions about labor and delivery. ? Make a trial run to the hospital. ? Visit the hospital and tour the maternity area. ? Arrange for maternity or paternity leave through employers. ? Arrange for family and friends to take care of pets while you are in the hospital. ? Purchase a rear-facing car seat and make sure you know how to install it in your car. ? Pack your hospital bag. ? Prepare the baby's nursery. Make sure to remove all pillows and stuffed animals from the baby's crib to prevent suffocation.  Visit your dentist if you have not gone during your pregnancy. Use a soft toothbrush to brush your teeth and be gentle when you floss. Contact a health care provider if:  You are unsure if you are in labor or if your water has broken.  You become dizzy.  You have mild pelvic cramps, pelvic pressure, or nagging pain in your abdominal area.  You have lower back pain.  You have persistent nausea, vomiting, or  diarrhea.  You have an unusual or bad smelling vaginal discharge.  You have pain when you urinate. Get help right away if:  Your water breaks before 37 weeks.  You have regular contractions less than 5 minutes apart before 37 weeks.  You have a fever.  You are leaking fluid from your vagina.  You have spotting or bleeding from your vagina.  You have severe abdominal pain or cramping.  You have rapid weight loss or weight gain.  You have   shortness of breath with chest pain.  You notice sudden or extreme swelling of your face, hands, ankles, feet, or legs.  Your baby makes fewer than 10 movements in 2 hours.  You have severe headaches that do not go away when you take medicine.  You have vision changes. Summary  The third trimester is from week 28 through week 40, months 7 through 9. The third trimester is a time when the unborn baby (fetus) is growing rapidly.  During the third trimester, your discomfort may increase as you and your baby continue to gain weight. You may have abdominal, leg, and back pain, sleeping problems, and an increased need to urinate.  During the third trimester your breasts will keep growing and they will continue to become tender. A yellow fluid (colostrum) may leak from your breasts. This is the first milk you are producing for your baby.  False labor is a condition in which you feel small, irregular tightenings of the muscles in the womb (contractions) that eventually go away. These are called Braxton Hicks contractions. Contractions may last for hours, days, or even weeks before true labor sets in.  Signs of labor can include: abdominal cramps; regular contractions that start at 10 minutes apart and become stronger and more frequent with time; watery or bloody mucus discharge that comes from the vagina; increased pelvic pressure and dull back pain; and leaking of amniotic fluid. This information is not intended to replace advice given to you by your  health care provider. Make sure you discuss any questions you have with your health care provider. Document Revised: 08/07/2018 Document Reviewed: 05/22/2016 Elsevier Patient Education  2020 [employer]  

## 2019-09-22 NOTE — Progress Notes (Signed)
   PRENATAL VISIT NOTE  Subjective:  Sandra Farrell is a 34 y.o. [id number] at [redacted]w[redacted]d being seen today for ongoing prenatal care.  She is currently monitored for the following issues for this low-risk pregnancy and has Supervision of other normal pregnancy, antepartum; Sickle cell trait ([place]); GBS bacteriuria; Alpha thalassemia silent carrier; Uterine fibroid in pregnancy; and Diastasis of rectus abdominis on their problem list.  Patient reports persistent episodes of dizziness and headaches, partially relieved by Benadryl.  Contractions: Not present. Vag. Bleeding: None.  Movement: Present. Denies leaking of fluid.   The following portions of the patient's history were reviewed and updated as appropriate: allergies, current medications, past family history, past medical history, past social history, past surgical history and problem list.   Objective:   Vitals:   09/22/19 0928  BP: 112/75  Pulse: 99  Weight: 241 lb (109.3 kg)    Fetal Status: Fetal Heart Rate (bpm): 134   Movement: Present     General:  Alert, oriented and cooperative. Patient is in no acute distress.  Skin: Skin is warm and dry. No rash noted.   Cardiovascular: Normal heart rate noted  Respiratory: Normal respiratory effort, no problems with respiration noted  Abdomen: Soft, gravid, appropriate for gestational age.  Pain/Pressure: Absent     Pelvic: Cervical exam deferred        Extremities: Normal range of motion.  Edema: None  Mental Status: Normal mood and affect. Normal behavior. Normal judgment and thought content.   Assessment and Plan:  Pregnancy: [id number] at [redacted]w[redacted]d 1.  Supervision normal pregnancy  2.   Headaches and Dizziness      Discussed issues.  Differential includes sinus congestion/eustachian dysfunction vs Migraine      Will try 48hrs of Sudafed      Will add Reglan 5mg  to try for headache with addition of Benadryl prn headache      If Sudafed does not help will refer to migraine specialist  Allie Dimmer . Preterm labor symptoms and general obstetric precautions including but not limited to vaginal bleeding, contractions, leaking of fluid and fetal movement were reviewed in detail with the patient. Please refer to After Visit Summary for other counseling recommendations.   Return in about 2 weeks (around 10/06/2019) for [facility].  Future Appointments  Date Time Provider [place]  09/29/2019  9:30 AM [facility] NURSE [facility] None  10/06/2019  9:30 AM Seabron Spates, CNM [facility] None    Hansel Feinstein, CNM

## 2019-09-29 ENCOUNTER — Other Ambulatory Visit: Payer: Self-pay

## 2019-09-29 ENCOUNTER — Other Ambulatory Visit (INDEPENDENT_AMBULATORY_CARE_PROVIDER_SITE_OTHER): Payer: Medicaid Other

## 2019-09-29 DIAGNOSIS — B9689 Other specified bacterial agents as the cause of diseases classified elsewhere: Secondary | ICD-10-CM

## 2019-09-29 DIAGNOSIS — Z3A32 32 weeks gestation of pregnancy: Secondary | ICD-10-CM

## 2019-09-29 MED ORDER — METRONIDAZOLE 500 MG PO TABS: 500.0000 mg | ORAL_TABLET | Freq: Two times a day (BID) | ORAL | 0 refills | Status: DC

## 2019-09-29 NOTE — Progress Notes (Signed)
Pt sent Mychart message requesting a Rx for Flagyl. Pt states she is having discharge with a fishy odor. Medication was sent to the pharmacy.

## 2019-09-29 NOTE — Progress Notes (Signed)
Pt presents for FHR. FHR is 131. Kiran Carline l Denzal Meir, CMA

## 2019-10-06 ENCOUNTER — Ambulatory Visit (INDEPENDENT_AMBULATORY_CARE_PROVIDER_SITE_OTHER): Payer: Medicaid Other | Admitting: Advanced Practice Midwife

## 2019-10-06 ENCOUNTER — Encounter: Payer: Self-pay | Admitting: Advanced Practice Midwife

## 2019-10-06 ENCOUNTER — Other Ambulatory Visit: Payer: Self-pay

## 2019-10-06 VITALS — BP 106/64 | HR 95 | Wt 243.0 lb

## 2019-10-06 DIAGNOSIS — R42 Dizziness and giddiness: Secondary | ICD-10-CM

## 2019-10-06 DIAGNOSIS — Z3480 Encounter for supervision of other normal pregnancy, unspecified trimester: Secondary | ICD-10-CM

## 2019-10-06 DIAGNOSIS — B9689 Other specified bacterial agents as the cause of diseases classified elsewhere: Secondary | ICD-10-CM

## 2019-10-06 DIAGNOSIS — Z3A34 34 weeks gestation of pregnancy: Secondary | ICD-10-CM

## 2019-10-06 DIAGNOSIS — O26893 Other specified pregnancy related conditions, third trimester: Secondary | ICD-10-CM

## 2019-10-06 MED ORDER — MECLIZINE HCL 25 MG PO TABS: 25.0000 mg | ORAL_TABLET | Freq: Three times a day (TID) | ORAL | 0 refills | Status: DC | PRN

## 2019-10-06 NOTE — Progress Notes (Signed)
   PRENATAL VISIT NOTE  Subjective:  Sandra Farrell is a 34 y.o. [id number] at [redacted]w[redacted]d being seen today for ongoing prenatal care.  She is currently monitored for the following issues for this low-risk pregnancy and has Supervision of other normal pregnancy, antepartum; Sickle cell trait ([place]); GBS bacteriuria; Alpha thalassemia silent carrier; Uterine fibroid in pregnancy; and Diastasis of rectus abdominis on their problem list.  Patient reports no complaints.  Contractions: Irregular. Vag. Bleeding: None.  Movement: Present. Denies leaking of fluid.  Still having some dizziness at times.  Improved with Sudafed.  Headaches greatly improved with Reglan  The following portions of the patient's history were reviewed and updated as appropriate: allergies, current medications, past family history, past medical history, past social history, past surgical history and problem list.   Objective:   Vitals:   10/06/19 0944  BP: 106/64  Pulse: 95  Weight: 243 lb (110.2 kg)    Fetal Status: Fetal Heart Rate (bpm): 137   Movement: Present     General:  Alert, oriented and cooperative. Patient is in no acute distress.  Skin: Skin is warm and dry. No rash noted.   Cardiovascular: Normal heart rate noted  Respiratory: Normal respiratory effort, no problems with respiration noted  Abdomen: Soft, gravid, appropriate for gestational age.  Pain/Pressure: Present     Pelvic: Cervical exam deferred        Extremities: Normal range of motion.  Edema: None  Mental Status: Normal mood and affect. Normal behavior. Normal judgment and thought content.   Assessment and Plan:  Pregnancy: [id number] at [redacted]w[redacted]d Dizziness/Vertigo Rx Meclizine sent for prn use   Preterm labor symptoms and general obstetric precautions including but not limited to vaginal bleeding, contractions, leaking of fluid and fetal movement were reviewed in detail with the patient. Please refer to After Visit Summary for other counseling  recommendations.   Return in about 2 weeks (around 10/20/2019) for [facility].  Future Appointments  Date Time Provider [place]  10/13/2019  9:30 AM [facility] NURSE [facility] None  10/20/2019  9:10 AM Seabron Spates, CNM [facility] None    Hansel Feinstein, CNM

## 2019-10-06 NOTE — Patient Instructions (Signed)
Third Trimester of Pregnancy The third trimester is from week 28 through week 40 (months 7 through 9). The third trimester is a time when the unborn baby (fetus) is growing rapidly. At the end of the ninth month, the fetus is about 20 inches in length and weighs 6-10 pounds. Body changes during your third trimester Your body will continue to go through many changes during pregnancy. The changes vary from woman to woman. During the third trimester:  Your weight will continue to increase. You can expect to gain 25-35 pounds (11-16 kg) by the end of the pregnancy.  You may begin to get stretch marks on your hips, abdomen, and breasts.  You may urinate more often because the fetus is moving lower into your pelvis and pressing on your bladder.  You may develop or continue to have heartburn. This is caused by increased hormones that slow down muscles in the digestive tract.  You may develop or continue to have constipation because increased hormones slow digestion and cause the muscles that push waste through your intestines to relax.  You may develop hemorrhoids. These are swollen veins (varicose veins) in the rectum that can itch or be painful.  You may develop swollen, bulging veins (varicose veins) in your legs.  You may have increased body aches in the pelvis, back, or thighs. This is due to weight gain and increased hormones that are relaxing your joints.  You may have changes in your hair. These can include thickening of your hair, rapid growth, and changes in texture. Some women also have hair loss during or after pregnancy, or hair that feels dry or thin. Your hair will most likely return to normal after your baby is born.  Your breasts will continue to grow and they will continue to become tender. A yellow fluid (colostrum) may leak from your breasts. This is the first milk you are producing for your baby.  Your belly button may stick out.  You may notice more swelling in your hands,  face, or ankles.  You may have increased tingling or numbness in your hands, arms, and legs. The skin on your belly may also feel numb.  You may feel short of breath because of your expanding uterus.  You may have more problems sleeping. This can be caused by the size of your belly, increased need to urinate, and an increase in your body's metabolism.  You may notice the fetus "dropping," or moving lower in your abdomen (lightening).  You may have increased vaginal discharge.  You may notice your joints feel loose and you may have pain around your pelvic bone. What to expect at prenatal visits You will have prenatal exams every 2 weeks until week 36. Then you will have weekly prenatal exams. During a routine prenatal visit:  You will be weighed to make sure you and the baby are growing normally.  Your blood pressure will be taken.  Your abdomen will be measured to track your baby's growth.  The fetal heartbeat will be listened to.  Any test results from the previous visit will be discussed.  You may have a cervical check near your due date to see if your cervix has softened or thinned (effaced).  You will be tested for Group B streptococcus. This happens between 35 and 37 weeks. Your health care provider may ask you:  What your birth plan is.  How you are feeling.  If you are feeling the baby move.  If you have had any abnormal   symptoms, such as leaking fluid, bleeding, severe headaches, or abdominal cramping.  If you are using any tobacco products, including cigarettes, chewing tobacco, and electronic cigarettes.  If you have any questions. Other tests or screenings that may be performed during your third trimester include:  Blood tests that check for low iron levels (anemia).  Fetal testing to check the health, activity level, and growth of the fetus. Testing is done if you have certain medical conditions or if there are problems during the pregnancy.  Nonstress test  (NST). This test checks the health of your baby to make sure there are no signs of problems, such as the baby not getting enough oxygen. During this test, a belt is placed around your belly. The baby is made to move, and its heart rate is monitored during movement. What is false labor? False labor is a condition in which you feel small, irregular tightenings of the muscles in the womb (contractions) that usually go away with rest, changing position, or drinking water. These are called Braxton Hicks contractions. Contractions may last for hours, days, or even weeks before true labor sets in. If contractions come at regular intervals, become more frequent, increase in intensity, or become painful, you should see your health care provider. What are the signs of labor?  Abdominal cramps.  Regular contractions that start at 10 minutes apart and become stronger and more frequent with time.  Contractions that start on the top of the uterus and spread down to the lower abdomen and back.  Increased pelvic pressure and dull back pain.  A watery or bloody mucus discharge that comes from the vagina.  Leaking of amniotic fluid. This is also known as your "water breaking." It could be a slow trickle or a gush. Let your health care provider know if it has a color or strange odor. If you have any of these signs, call your health care provider right away, even if it is before your due date. Follow these instructions at home: Medicines  Follow your health care provider's instructions regarding medicine use. Specific medicines may be either safe or unsafe to take during pregnancy.  Take a prenatal vitamin that contains at least 600 micrograms (mcg) of folic acid.  If you develop constipation, try taking a stool softener if your health care provider approves. Eating and drinking   Eat a balanced diet that includes fresh fruits and vegetables, whole grains, good sources of protein such as meat, eggs, or tofu,  and low-fat dairy. Your health care provider will help you determine the amount of weight gain that is right for you.  Avoid raw meat and uncooked cheese. These carry germs that can cause birth defects in the baby.  If you have low calcium intake from food, talk to your health care provider about whether you should take a daily calcium supplement.  Eat four or five small meals rather than three large meals a day.  Limit foods that are high in fat and processed sugars, such as fried and sweet foods.  To prevent constipation: ? Drink enough fluid to keep your urine clear or pale yellow. ? Eat foods that are high in fiber, such as fresh fruits and vegetables, whole grains, and beans. Activity  Exercise only as directed by your health care provider. Most women can continue their usual exercise routine during pregnancy. Try to exercise for 30 minutes at least 5 days a week. Stop exercising if you experience uterine contractions.  Avoid heavy lifting.  Do   not exercise in extreme heat or humidity, or at high altitudes.  Wear low-heel, comfortable shoes.  Practice good posture.  You may continue to have sex unless your health care provider tells you otherwise. Relieving pain and discomfort  Take frequent breaks and rest with your legs elevated if you have leg cramps or low back pain.  Take warm sitz baths to soothe any pain or discomfort caused by hemorrhoids. Use hemorrhoid cream if your health care provider approves.  Wear a good support bra to prevent discomfort from breast tenderness.  If you develop varicose veins: ? Wear support pantyhose or compression stockings as told by your healthcare provider. ? Elevate your feet for 15 minutes, 3-4 times a day. Prenatal care  Write down your questions. Take them to your prenatal visits.  Keep all your prenatal visits as told by your health care provider. This is important. Safety  Wear your seat belt at all times when driving.  Make  a list of emergency phone numbers, including numbers for family, friends, the hospital, and police and fire departments. General instructions  Avoid cat litter boxes and soil used by cats. These carry germs that can cause birth defects in the baby. If you have a cat, ask someone to clean the litter box for you.  Do not travel far distances unless it is absolutely necessary and only with the approval of your health care provider.  Do not use hot tubs, steam rooms, or saunas.  Do not drink alcohol.  Do not use any products that contain nicotine or tobacco, such as cigarettes and e-cigarettes. If you need help quitting, ask your health care provider.  Do not use any medicinal herbs or unprescribed drugs. These chemicals affect the formation and growth of the baby.  Do not douche or use tampons or scented sanitary pads.  Do not cross your legs for long periods of time.  To prepare for the arrival of your baby: ? Take prenatal classes to understand, practice, and ask questions about labor and delivery. ? Make a trial run to the hospital. ? Visit the hospital and tour the maternity area. ? Arrange for maternity or paternity leave through employers. ? Arrange for family and friends to take care of pets while you are in the hospital. ? Purchase a rear-facing car seat and make sure you know how to install it in your car. ? Pack your hospital bag. ? Prepare the baby's nursery. Make sure to remove all pillows and stuffed animals from the baby's crib to prevent suffocation.  Visit your dentist if you have not gone during your pregnancy. Use a soft toothbrush to brush your teeth and be gentle when you floss. Contact a health care provider if:  You are unsure if you are in labor or if your water has broken.  You become dizzy.  You have mild pelvic cramps, pelvic pressure, or nagging pain in your abdominal area.  You have lower back pain.  You have persistent nausea, vomiting, or  diarrhea.  You have an unusual or bad smelling vaginal discharge.  You have pain when you urinate. Get help right away if:  Your water breaks before 37 weeks.  You have regular contractions less than 5 minutes apart before 37 weeks.  You have a fever.  You are leaking fluid from your vagina.  You have spotting or bleeding from your vagina.  You have severe abdominal pain or cramping.  You have rapid weight loss or weight gain.  You have   shortness of breath with chest pain.  You notice sudden or extreme swelling of your face, hands, ankles, feet, or legs.  Your baby makes fewer than 10 movements in 2 hours.  You have severe headaches that do not go away when you take medicine.  You have vision changes. Summary  The third trimester is from week 28 through week 40, months 7 through 9. The third trimester is a time when the unborn baby (fetus) is growing rapidly.  During the third trimester, your discomfort may increase as you and your baby continue to gain weight. You may have abdominal, leg, and back pain, sleeping problems, and an increased need to urinate.  During the third trimester your breasts will keep growing and they will continue to become tender. A yellow fluid (colostrum) may leak from your breasts. This is the first milk you are producing for your baby.  False labor is a condition in which you feel small, irregular tightenings of the muscles in the womb (contractions) that eventually go away. These are called Braxton Hicks contractions. Contractions may last for hours, days, or even weeks before true labor sets in.  Signs of labor can include: abdominal cramps; regular contractions that start at 10 minutes apart and become stronger and more frequent with time; watery or bloody mucus discharge that comes from the vagina; increased pelvic pressure and dull back pain; and leaking of amniotic fluid. This information is not intended to replace advice given to you by your  health care provider. Make sure you discuss any questions you have with your health care provider. Document Revised: 08/07/2018 Document Reviewed: 05/22/2016 Elsevier Patient Education  2020 [employer]  

## 2019-10-13 ENCOUNTER — Other Ambulatory Visit: Payer: Self-pay

## 2019-10-13 ENCOUNTER — Ambulatory Visit: Payer: Medicaid Other

## 2019-10-13 DIAGNOSIS — O3410 Maternal care for benign tumor of corpus uteri, unspecified trimester: Secondary | ICD-10-CM

## 2019-10-13 DIAGNOSIS — D259 Leiomyoma of uterus, unspecified: Secondary | ICD-10-CM

## 2019-10-13 NOTE — Progress Notes (Addendum)
Patient presents for fetal heart tones per recommendation of MFM. Kathrene Alu RN   Attestation of Attending Supervision of RN: Evaluation and management procedures were performed by the nurse under my supervision and collaboration.  I have reviewed the nursing note and chart, and I agree with the management and plan. FHR on flowsheet and WNL.   Carolyn L. Harraway-Smith, M.D., Cherlynn June

## 2019-10-20 ENCOUNTER — Encounter: Payer: Self-pay | Admitting: Advanced Practice Midwife

## 2019-10-20 ENCOUNTER — Other Ambulatory Visit (HOSPITAL_COMMUNITY)
Admission: RE | Admit: 2019-10-20 | Discharge: 2019-10-20 | Disposition: A | Discharge status: Home / Self Care | Payer: Medicaid Other | Source: Ambulatory Visit | Attending: Advanced Practice Midwife | Admitting: Advanced Practice Midwife

## 2019-10-20 ENCOUNTER — Ambulatory Visit (INDEPENDENT_AMBULATORY_CARE_PROVIDER_SITE_OTHER): Payer: Medicaid Other | Admitting: Advanced Practice Midwife

## 2019-10-20 ENCOUNTER — Other Ambulatory Visit: Payer: Self-pay

## 2019-10-20 VITALS — BP 91/77 | HR 108 | Wt 241.0 lb

## 2019-10-20 DIAGNOSIS — Z3A36 36 weeks gestation of pregnancy: Secondary | ICD-10-CM

## 2019-10-20 DIAGNOSIS — Z3480 Encounter for supervision of other normal pregnancy, unspecified trimester: Secondary | ICD-10-CM | POA: Insufficient documentation

## 2019-10-20 DIAGNOSIS — R8271 Bacteriuria: Secondary | ICD-10-CM

## 2019-10-20 DIAGNOSIS — O99820 Streptococcus B carrier state complicating pregnancy: Secondary | ICD-10-CM

## 2019-10-20 NOTE — Progress Notes (Signed)
   PRENATAL VISIT NOTE  Subjective:  Sandra Farrell is a 34 y.o. [id number] at [redacted]w[redacted]d being seen today for ongoing prenatal care.  She is currently monitored for the following issues for this low-risk pregnancy and has Supervision of other normal pregnancy, antepartum; Sickle cell trait ([place]); GBS bacteriuria; Alpha thalassemia silent carrier; Uterine fibroid in pregnancy; and Diastasis of rectus abdominis on their problem list.  Patient reports occasional contractions.  Contractions: Irregular. Vag. Bleeding: None.  Movement: Present. Denies leaking of fluid.   The following portions of the patient's history were reviewed and updated as appropriate: allergies, current medications, past family history, past medical history, past social history, past surgical history and problem list.   Objective:   Vitals:   10/20/19 0908  BP: 91/77  Pulse: (!) 108  Weight: 241 lb (109.3 kg)    Fetal Status: Fetal Heart Rate (bpm): 166   Movement: Present     General:  Alert, oriented and cooperative. Patient is in no acute distress.  Skin: Skin is warm and dry. No rash noted.   Cardiovascular: Normal heart rate noted  Respiratory: Normal respiratory effort, no problems with respiration noted  Abdomen: Soft, gravid, appropriate for gestational age.  Pain/Pressure: Present     Pelvic: Cervical exam performed in the presence of a chaperone      Closed/soft/30%/Balltotable, unsure of presentation > confirmed vertex by ultrasound  Extremities: Normal range of motion.  Edema: None  Mental Status: Normal mood and affect. Normal behavior. Normal judgment and thought content.   Assessment and Plan:  Pregnancy: [id number] at [redacted]w[redacted]d 1. GBS bacteriuria      Treat in labor  2. Supervision of other normal pregnancy, antepartum     Vertex confirmed - Cervicovaginal ancillary only( [place])  Preterm labor symptoms and general obstetric precautions including but not limited to vaginal bleeding, contractions,  leaking of fluid and fetal movement were reviewed in detail with the patient. Please refer to After Visit Summary for other counseling recommendations.   Return in about 1 week (around 10/27/2019) for [facility].  Future Appointments  Date Time Provider [place]  10/26/2019  8:30 AM Lavonia Drafts, MD [facility] None  11/03/2019  9:30 AM Seabron Spates, CNM [facility] None    Hansel Feinstein, CNM

## 2019-10-20 NOTE — Patient Instructions (Signed)
Third Trimester of Pregnancy The third trimester is from week 28 through week 40 (months 7 through 9). The third trimester is a time when the unborn baby (fetus) is growing rapidly. At the end of the ninth month, the fetus is about 20 inches in length and weighs 6-10 pounds. Body changes during your third trimester Your body will continue to go through many changes during pregnancy. The changes vary from woman to woman. During the third trimester:  Your weight will continue to increase. You can expect to gain 25-35 pounds (11-16 kg) by the end of the pregnancy.  You may begin to get stretch marks on your hips, abdomen, and breasts.  You may urinate more often because the fetus is moving lower into your pelvis and pressing on your bladder.  You may develop or continue to have heartburn. This is caused by increased hormones that slow down muscles in the digestive tract.  You may develop or continue to have constipation because increased hormones slow digestion and cause the muscles that push waste through your intestines to relax.  You may develop hemorrhoids. These are swollen veins (varicose veins) in the rectum that can itch or be painful.  You may develop swollen, bulging veins (varicose veins) in your legs.  You may have increased body aches in the pelvis, back, or thighs. This is due to weight gain and increased hormones that are relaxing your joints.  You may have changes in your hair. These can include thickening of your hair, rapid growth, and changes in texture. Some women also have hair loss during or after pregnancy, or hair that feels dry or thin. Your hair will most likely return to normal after your baby is born.  Your breasts will continue to grow and they will continue to become tender. A yellow fluid (colostrum) may leak from your breasts. This is the first milk you are producing for your baby.  Your belly button may stick out.  You may notice more swelling in your hands,  face, or ankles.  You may have increased tingling or numbness in your hands, arms, and legs. The skin on your belly may also feel numb.  You may feel short of breath because of your expanding uterus.  You may have more problems sleeping. This can be caused by the size of your belly, increased need to urinate, and an increase in your body's metabolism.  You may notice the fetus "dropping," or moving lower in your abdomen (lightening).  You may have increased vaginal discharge.  You may notice your joints feel loose and you may have pain around your pelvic bone. What to expect at prenatal visits You will have prenatal exams every 2 weeks until week 36. Then you will have weekly prenatal exams. During a routine prenatal visit:  You will be weighed to make sure you and the baby are growing normally.  Your blood pressure will be taken.  Your abdomen will be measured to track your baby's growth.  The fetal heartbeat will be listened to.  Any test results from the previous visit will be discussed.  You may have a cervical check near your due date to see if your cervix has softened or thinned (effaced).  You will be tested for Group B streptococcus. This happens between 35 and 37 weeks. Your health care provider may ask you:  What your birth plan is.  How you are feeling.  If you are feeling the baby move.  If you have had any abnormal   symptoms, such as leaking fluid, bleeding, severe headaches, or abdominal cramping.  If you are using any tobacco products, including cigarettes, chewing tobacco, and electronic cigarettes.  If you have any questions. Other tests or screenings that may be performed during your third trimester include:  Blood tests that check for low iron levels (anemia).  Fetal testing to check the health, activity level, and growth of the fetus. Testing is done if you have certain medical conditions or if there are problems during the pregnancy.  Nonstress test  (NST). This test checks the health of your baby to make sure there are no signs of problems, such as the baby not getting enough oxygen. During this test, a belt is placed around your belly. The baby is made to move, and its heart rate is monitored during movement. What is false labor? False labor is a condition in which you feel small, irregular tightenings of the muscles in the womb (contractions) that usually go away with rest, changing position, or drinking water. These are called Braxton Hicks contractions. Contractions may last for hours, days, or even weeks before true labor sets in. If contractions come at regular intervals, become more frequent, increase in intensity, or become painful, you should see your health care provider. What are the signs of labor?  Abdominal cramps.  Regular contractions that start at 10 minutes apart and become stronger and more frequent with time.  Contractions that start on the top of the uterus and spread down to the lower abdomen and back.  Increased pelvic pressure and dull back pain.  A watery or bloody mucus discharge that comes from the vagina.  Leaking of amniotic fluid. This is also known as your "water breaking." It could be a slow trickle or a gush. Let your health care provider know if it has a color or strange odor. If you have any of these signs, call your health care provider right away, even if it is before your due date. Follow these instructions at home: Medicines  Follow your health care provider's instructions regarding medicine use. Specific medicines may be either safe or unsafe to take during pregnancy.  Take a prenatal vitamin that contains at least 600 micrograms (mcg) of folic acid.  If you develop constipation, try taking a stool softener if your health care provider approves. Eating and drinking   Eat a balanced diet that includes fresh fruits and vegetables, whole grains, good sources of protein such as meat, eggs, or tofu,  and low-fat dairy. Your health care provider will help you determine the amount of weight gain that is right for you.  Avoid raw meat and uncooked cheese. These carry germs that can cause birth defects in the baby.  If you have low calcium intake from food, talk to your health care provider about whether you should take a daily calcium supplement.  Eat four or five small meals rather than three large meals a day.  Limit foods that are high in fat and processed sugars, such as fried and sweet foods.  To prevent constipation: ? Drink enough fluid to keep your urine clear or pale yellow. ? Eat foods that are high in fiber, such as fresh fruits and vegetables, whole grains, and beans. Activity  Exercise only as directed by your health care provider. Most women can continue their usual exercise routine during pregnancy. Try to exercise for 30 minutes at least 5 days a week. Stop exercising if you experience uterine contractions.  Avoid heavy lifting.  Do   not exercise in extreme heat or humidity, or at high altitudes.  Wear low-heel, comfortable shoes.  Practice good posture.  You may continue to have sex unless your health care provider tells you otherwise. Relieving pain and discomfort  Take frequent breaks and rest with your legs elevated if you have leg cramps or low back pain.  Take warm sitz baths to soothe any pain or discomfort caused by hemorrhoids. Use hemorrhoid cream if your health care provider approves.  Wear a good support bra to prevent discomfort from breast tenderness.  If you develop varicose veins: ? Wear support pantyhose or compression stockings as told by your healthcare provider. ? Elevate your feet for 15 minutes, 3-4 times a day. Prenatal care  Write down your questions. Take them to your prenatal visits.  Keep all your prenatal visits as told by your health care provider. This is important. Safety  Wear your seat belt at all times when driving.  Make  a list of emergency phone numbers, including numbers for family, friends, the hospital, and police and fire departments. General instructions  Avoid cat litter boxes and soil used by cats. These carry germs that can cause birth defects in the baby. If you have a cat, ask someone to clean the litter box for you.  Do not travel far distances unless it is absolutely necessary and only with the approval of your health care provider.  Do not use hot tubs, steam rooms, or saunas.  Do not drink alcohol.  Do not use any products that contain nicotine or tobacco, such as cigarettes and e-cigarettes. If you need help quitting, ask your health care provider.  Do not use any medicinal herbs or unprescribed drugs. These chemicals affect the formation and growth of the baby.  Do not douche or use tampons or scented sanitary pads.  Do not cross your legs for long periods of time.  To prepare for the arrival of your baby: ? Take prenatal classes to understand, practice, and ask questions about labor and delivery. ? Make a trial run to the hospital. ? Visit the hospital and tour the maternity area. ? Arrange for maternity or paternity leave through employers. ? Arrange for family and friends to take care of pets while you are in the hospital. ? Purchase a rear-facing car seat and make sure you know how to install it in your car. ? Pack your hospital bag. ? Prepare the baby's nursery. Make sure to remove all pillows and stuffed animals from the baby's crib to prevent suffocation.  Visit your dentist if you have not gone during your pregnancy. Use a soft toothbrush to brush your teeth and be gentle when you floss. Contact a health care provider if:  You are unsure if you are in labor or if your water has broken.  You become dizzy.  You have mild pelvic cramps, pelvic pressure, or nagging pain in your abdominal area.  You have lower back pain.  You have persistent nausea, vomiting, or  diarrhea.  You have an unusual or bad smelling vaginal discharge.  You have pain when you urinate. Get help right away if:  Your water breaks before 37 weeks.  You have regular contractions less than 5 minutes apart before 37 weeks.  You have a fever.  You are leaking fluid from your vagina.  You have spotting or bleeding from your vagina.  You have severe abdominal pain or cramping.  You have rapid weight loss or weight gain.  You have   shortness of breath with chest pain.  You notice sudden or extreme swelling of your face, hands, ankles, feet, or legs.  Your baby makes fewer than 10 movements in 2 hours.  You have severe headaches that do not go away when you take medicine.  You have vision changes. Summary  The third trimester is from week 28 through week 40, months 7 through 9. The third trimester is a time when the unborn baby (fetus) is growing rapidly.  During the third trimester, your discomfort may increase as you and your baby continue to gain weight. You may have abdominal, leg, and back pain, sleeping problems, and an increased need to urinate.  During the third trimester your breasts will keep growing and they will continue to become tender. A yellow fluid (colostrum) may leak from your breasts. This is the first milk you are producing for your baby.  False labor is a condition in which you feel small, irregular tightenings of the muscles in the womb (contractions) that eventually go away. These are called Braxton Hicks contractions. Contractions may last for hours, days, or even weeks before true labor sets in.  Signs of labor can include: abdominal cramps; regular contractions that start at 10 minutes apart and become stronger and more frequent with time; watery or bloody mucus discharge that comes from the vagina; increased pelvic pressure and dull back pain; and leaking of amniotic fluid. This information is not intended to replace advice given to you by your  health care provider. Make sure you discuss any questions you have with your health care provider. Document Revised: 08/07/2018 Document Reviewed: 05/22/2016 Elsevier Patient Education  2020 [employer]  

## 2019-10-21 LAB — CERVICOVAGINAL ANCILLARY ONLY
Chlamydia: NEGATIVE
Comment: NEGATIVE
Comment: NORMAL
Neisseria Gonorrhea: NEGATIVE

## 2019-10-26 ENCOUNTER — Ambulatory Visit (INDEPENDENT_AMBULATORY_CARE_PROVIDER_SITE_OTHER): Payer: Medicaid Other | Admitting: Obstetrics & Gynecology

## 2019-10-26 ENCOUNTER — Other Ambulatory Visit: Payer: Self-pay

## 2019-10-26 VITALS — BP 126/76 | HR 85 | Wt 245.0 lb

## 2019-10-26 DIAGNOSIS — O3413 Maternal care for benign tumor of corpus uteri, third trimester: Secondary | ICD-10-CM

## 2019-10-26 DIAGNOSIS — O368330 Maternal care for abnormalities of the fetal heart rate or rhythm, third trimester, not applicable or unspecified: Secondary | ICD-10-CM

## 2019-10-26 DIAGNOSIS — D573 Sickle-cell trait: Secondary | ICD-10-CM

## 2019-10-26 DIAGNOSIS — Z3A37 37 weeks gestation of pregnancy: Secondary | ICD-10-CM

## 2019-10-26 DIAGNOSIS — D259 Leiomyoma of uterus, unspecified: Secondary | ICD-10-CM

## 2019-10-26 DIAGNOSIS — O99820 Streptococcus B carrier state complicating pregnancy: Secondary | ICD-10-CM

## 2019-10-26 DIAGNOSIS — Z3480 Encounter for supervision of other normal pregnancy, unspecified trimester: Secondary | ICD-10-CM

## 2019-10-26 DIAGNOSIS — M6208 Separation of muscle (nontraumatic), other site: Secondary | ICD-10-CM

## 2019-10-26 DIAGNOSIS — R8271 Bacteriuria: Secondary | ICD-10-CM

## 2019-10-26 DIAGNOSIS — O26893 Other specified pregnancy related conditions, third trimester: Secondary | ICD-10-CM

## 2019-10-26 DIAGNOSIS — O368390 Maternal care for abnormalities of the fetal heart rate or rhythm, unspecified trimester, not applicable or unspecified: Secondary | ICD-10-CM | POA: Insufficient documentation

## 2019-10-26 NOTE — Progress Notes (Signed)
   PRENATAL VISIT NOTE  Subjective:  Sandra Farrell is a 34 y.o. [id number] at [redacted]w[redacted]d being seen today for ongoing prenatal care.  She is currently monitored for the following issues for this low-risk pregnancy and has Supervision of other normal pregnancy, antepartum; Sickle cell trait ([place]); GBS bacteriuria; Alpha thalassemia silent carrier; Uterine fibroid in pregnancy; Diastasis of rectus abdominis; and Fetal arrhythmia affecting pregnancy, antepartum on their problem list.  Patient reports no complaints.  Contractions: Irritability. Vag. Bleeding: None.  Movement: Present. Denies leaking of fluid.   The following portions of the patient's history were reviewed and updated as appropriate: allergies, current medications, past family history, past medical history, past social history, past surgical history and problem list.   Objective:   Vitals:   10/26/19 0911  BP: 126/76  Pulse: 85  Weight: 245 lb (111.1 kg)    Fetal Status:     Movement: Present     General:  Alert, oriented and cooperative. Patient is in no acute distress.  Skin: Skin is warm and dry. No rash noted.   Cardiovascular: Normal heart rate noted  Respiratory: Normal respiratory effort, no problems with respiration noted  Abdomen: Soft, gravid, appropriate for gestational age.  Pain/Pressure: Present     Pelvic: Cervical exam deferred        Extremities: Normal range of motion.  Edema: None  Mental Status: Normal mood and affect. Normal behavior. Normal judgment and thought content.   Assessment and Plan:  Pregnancy: [id number] at [redacted]w[redacted]d 1. Supervision of other normal pregnancy, antepartum FHR and FH WNL   2. Diastasis of rectus abdominis  3. Uterine fibroid in pregnancy  4. GBS bacteriuria Needs atbx in labor.   5. Sickle cell trait ([place])  6. Fetal arrhythmia affecting pregnancy, antepartum Not heard today. Pt stopped drinking caffeine in soda. She is still drinking Gatorade and was not aware that it  contained Caffeine.    Term labor symptoms and general obstetric precautions including but not limited to vaginal bleeding, contractions, leaking of fluid and fetal movement were reviewed in detail with the patient. Please refer to After Visit Summary for other counseling recommendations.   Return in about 1 week (around 11/02/2019).  Future Appointments  Date Time Provider Department Center  11/03/2019  9:30 AM Seabron Spates, CNM [facility] None    Lavonia Drafts, MD

## 2019-10-26 NOTE — Patient Instructions (Signed)
Natural Childbirth Natural childbirth is when labor and delivery progresses naturally with minimal medical assistance or medicines. Natural childbirth may be an option if you have a low risk pregnancy. With the help of a birthing professional such as a midwife, doula, or other health care provider, you may be able to use relaxation techniques and controlled breathing to manage pain during labor. Many women choose natural childbirth because it makes them feel more in control and in touch with the experience of giving birth. Some women also choose natural childbirth because they are concerned that medicines may affect them and their babies. What types of natural childbirth techniques are available? There are two types of natural childbirth techniques, which are taught in classes:  The Lamaze method. In these classes, parents learn that having a baby is normal, healthy, and natural. Mothers are taught to take a neutral position regarding pain medicine and numbing medicines, and to make an informed decision about using these medicines if the time comes.  The [employer], also called husband-coached birth. In these classes, the father or partner learns to be the birth coach. The mother is encouraged to exercise and eat a balanced, nutritious diet. Both parents also learn relaxation and deep breathing exercises and are taught how to prepare for emergency situations. What are some natural pain and relaxation techniques? If you are considering a natural childbirth, you should explore your options for managing pain and discomfort during your labor and delivery. Some natural pain and relaxation techniques include:  Meditation.  Yoga.  Hypnosis.  Acupuncture.  Massage.  Changing positions, such as by walking, rocking, showering, or leaning on birth balls.  Lying in warm water or a whirlpool bath.  Finding an activity that keeps your mind off the labor pain.  Listening to soft music.  Focusing  on a particular object (visual imagery). How can I prepare for a natural birth?  Talk with your spouse or partner about your goals for having a natural childbirth.  Decide if you will have your baby in the hospital, at a birthing center, or at home.  Have your spouse or partner attend the natural childbirth technique classes with you.  Decide which type of health care provider you would like to assist you with your delivery.  If you have other children, make plans to have someone take care of them when you go to the hospital or birthing center.  Know the distance and the time it takes to go to the hospital or birthing center. Practice going there and time it to be sure.  Have a bag packed with a nightgown, bathrobe, and toiletries. Be ready to take it with you when you go into labor.  Keep phone numbers of your family and friends handy if you need to call someone when you go into labor.  Talk with your health care provider about the possibility of a medical emergency and what will happen if that occurs. What are the advantages and disadvantages of natural childbirth? Advantages  You are in control of your labor and delivery experience.  You may be able to avoid some common medical practices, such as getting medicines or being monitored all the time.  You and your spouse or partner will work together, which can increase your bond with each other.  In most delivery centers, your family and friends can be involved in the labor and delivery process. Disadvantages  The methods of helping relieve labor pains may not work for you.  You may feel  disappointed if you change your mind during labor and decide not to have a natural childbirth. What can I expect after delivery?  You may feel very tired.  You may feel uncomfortable as your uterus contracts.  The area around your vagina will feel sore.  You may feel cold and shaky. What questions should I ask my health care  provider?  Am I a good candidate for natural childbirth?  Can you refer me to classes to learn more about natural childbirth?  How do I create a birth plan that outlines my wishes for natural childbirth? Where to find more information  American Pregnancy Association: americanpregnancy.org  [employer] of Obstetricians and Gynecologists: acog.[place]: www.midwife.org Summary  Natural childbirth may be an option if you have a low risk pregnancy.  The Leory Plowman or Lamaze Methods are techniques that can assist you in achieving a natural birth experience.  Talk to your health care provider to determine if you are a good candidate for a natural childbirth. This information is not intended to replace advice given to you by your health care provider. Make sure you discuss any questions you have with your health care provider. Document Revised: 08/08/2018 Document Reviewed: 06/25/2016 Elsevier Patient Education  [place].

## 2019-11-03 ENCOUNTER — Ambulatory Visit (INDEPENDENT_AMBULATORY_CARE_PROVIDER_SITE_OTHER): Payer: Medicaid Other | Admitting: Advanced Practice Midwife

## 2019-11-03 ENCOUNTER — Other Ambulatory Visit: Payer: Self-pay

## 2019-11-03 VITALS — BP 108/72 | HR 84 | Wt 247.0 lb

## 2019-11-03 DIAGNOSIS — Z3480 Encounter for supervision of other normal pregnancy, unspecified trimester: Secondary | ICD-10-CM

## 2019-11-03 DIAGNOSIS — R8271 Bacteriuria: Secondary | ICD-10-CM

## 2019-11-03 DIAGNOSIS — O99820 Streptococcus B carrier state complicating pregnancy: Secondary | ICD-10-CM

## 2019-11-03 DIAGNOSIS — Z3A38 38 weeks gestation of pregnancy: Secondary | ICD-10-CM

## 2019-11-03 NOTE — Progress Notes (Signed)
   PRENATAL VISIT NOTE  Subjective:  Sandra Farrell is a 34 y.o. [id number] at [redacted]w[redacted]d being seen today for ongoing prenatal care.  She is currently monitored for the following issues for this low-risk pregnancy and has Supervision of other normal pregnancy, antepartum; Sickle cell trait ([place]); GBS bacteriuria; Alpha thalassemia silent carrier; Uterine fibroid in pregnancy; Diastasis of rectus abdominis; and Fetal arrhythmia affecting pregnancy, antepartum on their problem list.  Patient reports occasional contractions.  Contractions: Irregular. Vag. Bleeding: None.  Movement: Present. Denies leaking of fluid.   The following portions of the patient's history were reviewed and updated as appropriate: allergies, current medications, past family history, past medical history, past social history, past surgical history and problem list.   Objective:   Vitals:   11/03/19 0941  BP: 108/72  Pulse: 84  Weight: 247 lb (112 kg)    Fetal Status: Fetal Heart Rate (bpm): 140   Movement: Present     General:  Alert, oriented and cooperative. Patient is in no acute distress.  Skin: Skin is warm and dry. No rash noted.   Cardiovascular: Normal heart rate noted  Respiratory: Normal respiratory effort, no problems with respiration noted  Abdomen: Soft, gravid, appropriate for gestational age.  Pain/Pressure: Present     Pelvic:         Cervix Loose 1/50/-2/vertex   Extremities: Normal range of motion.  Edema: None  Mental Status: Normal mood and affect. Normal behavior. Normal judgment and thought content.   Assessment and Plan:  Pregnancy: [id number] at [redacted]w[redacted]d 1. Supervision of other normal pregnancy, antepartum    Passed mucous plug yesterday     Reviewed labor signs     Desires Waterbirth, Consent signe, Class certificate viewed by me and will scan into (pt has on phone)  2. GBS bacteriuria    Treat in labor  Term labor symptoms and general obstetric precautions including but not limited to  vaginal bleeding, contractions, leaking of fluid and fetal movement were reviewed in detail with the patient. Please refer to After Visit Summary for other counseling recommendations.   Return in about 1 week (around 11/10/2019) for [facility].  Future Appointments  Date Time Provider [place]  11/09/2019 11:00 AM Truett Mainland, DO [facility] None  12/18/2019 10:00 AM Truett Mainland, DO [facility] None    Hansel Feinstein, CNM

## 2019-11-03 NOTE — Patient Instructions (Signed)

## 2019-11-05 ENCOUNTER — Encounter (HOSPITAL_COMMUNITY): Payer: Self-pay | Admitting: Obstetrics & Gynecology

## 2019-11-05 ENCOUNTER — Inpatient Hospital Stay (HOSPITAL_COMMUNITY): Payer: Medicaid Other | Admitting: Anesthesiology

## 2019-11-05 ENCOUNTER — Other Ambulatory Visit: Payer: Self-pay

## 2019-11-05 ENCOUNTER — Inpatient Hospital Stay (HOSPITAL_COMMUNITY)
Admission: AD | Admit: 2019-11-05 | Discharge: 2019-11-07 | DRG: 798 | Disposition: A | Discharge status: Home / Self Care | Payer: Medicaid Other | Attending: Obstetrics & Gynecology | Admitting: Obstetrics & Gynecology

## 2019-11-05 DIAGNOSIS — R8271 Bacteriuria: Secondary | ICD-10-CM | POA: Diagnosis present

## 2019-11-05 DIAGNOSIS — O9902 Anemia complicating childbirth: Principal | ICD-10-CM | POA: Diagnosis present

## 2019-11-05 DIAGNOSIS — Z302 Encounter for sterilization: Secondary | ICD-10-CM | POA: Diagnosis not present

## 2019-11-05 DIAGNOSIS — Z20822 Contact with and (suspected) exposure to covid-19: Secondary | ICD-10-CM | POA: Diagnosis not present

## 2019-11-05 DIAGNOSIS — O3410 Maternal care for benign tumor of corpus uteri, unspecified trimester: Secondary | ICD-10-CM

## 2019-11-05 DIAGNOSIS — Z3480 Encounter for supervision of other normal pregnancy, unspecified trimester: Secondary | ICD-10-CM

## 2019-11-05 DIAGNOSIS — Z3A38 38 weeks gestation of pregnancy: Secondary | ICD-10-CM | POA: Diagnosis not present

## 2019-11-05 DIAGNOSIS — M6208 Separation of muscle (nontraumatic), other site: Secondary | ICD-10-CM | POA: Diagnosis present

## 2019-11-05 DIAGNOSIS — O368390 Maternal care for abnormalities of the fetal heart rate or rhythm, unspecified trimester, not applicable or unspecified: Secondary | ICD-10-CM

## 2019-11-05 DIAGNOSIS — D573 Sickle-cell trait: Secondary | ICD-10-CM | POA: Diagnosis not present

## 2019-11-05 DIAGNOSIS — O4202 Full-term premature rupture of membranes, onset of labor within 24 hours of rupture: Secondary | ICD-10-CM | POA: Diagnosis not present

## 2019-11-05 DIAGNOSIS — O99824 Streptococcus B carrier state complicating childbirth: Secondary | ICD-10-CM | POA: Diagnosis present

## 2019-11-05 DIAGNOSIS — O99214 Obesity complicating childbirth: Secondary | ICD-10-CM | POA: Diagnosis present

## 2019-11-05 DIAGNOSIS — D563 Thalassemia minor: Secondary | ICD-10-CM | POA: Diagnosis present

## 2019-11-05 DIAGNOSIS — O3413 Maternal care for benign tumor of corpus uteri, third trimester: Secondary | ICD-10-CM | POA: Diagnosis not present

## 2019-11-05 DIAGNOSIS — O26893 Other specified pregnancy related conditions, third trimester: Secondary | ICD-10-CM | POA: Diagnosis not present

## 2019-11-05 DIAGNOSIS — D259 Leiomyoma of uterus, unspecified: Secondary | ICD-10-CM | POA: Diagnosis not present

## 2019-11-05 DIAGNOSIS — Z9079 Acquired absence of other genital organ(s): Secondary | ICD-10-CM

## 2019-11-05 LAB — TYPE AND SCREEN
ABO/RH(D): O POS
Antibody Screen: NEGATIVE

## 2019-11-05 LAB — CBC
HCT: 31.1 % — ABNORMAL LOW (ref 36.0–46.0)
Hemoglobin: 9.5 g/dL — ABNORMAL LOW (ref 12.0–15.0)
MCH: 24.4 pg — ABNORMAL LOW (ref 26.0–34.0)
MCHC: 30.5 g/dL (ref 30.0–36.0)
MCV: 79.9 fL — ABNORMAL LOW (ref 80.0–100.0)
Platelets: 229 10*3/uL (ref 150–400)
RBC: 3.89 MIL/uL (ref 3.87–5.11)
RDW: 15.9 % — ABNORMAL HIGH (ref 11.5–15.5)
WBC: 7.3 10*3/uL (ref 4.0–10.5)
nRBC: 0 % (ref 0.0–0.2)

## 2019-11-05 LAB — RPR: RPR Ser Ql: NONREACTIVE

## 2019-11-05 LAB — SARS CORONAVIRUS 2 BY RT PCR (HOSPITAL ORDER, PERFORMED IN ~~LOC~~ HOSPITAL LAB): SARS Coronavirus 2: NEGATIVE

## 2019-11-05 LAB — POCT FERN TEST: POCT Fern Test: POSITIVE

## 2019-11-05 MED ORDER — OXYCODONE HCL 5 MG PO TABS: 10.0000 mg | ORAL_TABLET | ORAL | Status: DC | PRN

## 2019-11-05 MED ORDER — SENNOSIDES-DOCUSATE SODIUM 8.6-50 MG PO TABS
2.0000 | ORAL_TABLET | ORAL | Status: DC
  Administered 2019-11-05 – 2019-11-06 (×2): 2 via ORAL
  Filled 2019-11-05 (×2): qty 2

## 2019-11-05 MED ORDER — ACETAMINOPHEN 325 MG PO TABS: 650.0000 mg | ORAL_TABLET | ORAL | Status: DC | PRN

## 2019-11-05 MED ORDER — ONDANSETRON HCL 4 MG/2ML IJ SOLN: 4.0000 mg | INTRAMUSCULAR | Status: DC | PRN

## 2019-11-05 MED ORDER — SIMETHICONE 80 MG PO CHEW: 80.0000 mg | CHEWABLE_TABLET | ORAL | Status: DC | PRN

## 2019-11-05 MED ORDER — BENZOCAINE-MENTHOL 20-0.5 % EX AERO: 1.0000 "application " | INHALATION_SPRAY | CUTANEOUS | Status: DC | PRN

## 2019-11-05 MED ORDER — IBUPROFEN 600 MG PO TABS
600.0000 mg | ORAL_TABLET | Freq: Four times a day (QID) | ORAL | Status: DC
  Administered 2019-11-05 – 2019-11-07 (×6): 600 mg via ORAL
  Filled 2019-11-05 (×6): qty 1

## 2019-11-05 MED ORDER — LIDOCAINE HCL (PF) 1 % IJ SOLN
INTRAMUSCULAR | Status: DC | PRN
  Administered 2019-11-05: 6 mL via EPIDURAL

## 2019-11-05 MED ORDER — DIPHENHYDRAMINE HCL 50 MG/ML IJ SOLN
12.5000 mg | INTRAMUSCULAR | Status: DC | PRN
Start: 2019-11-05 — End: 2019-11-05

## 2019-11-05 MED ORDER — OXYTOCIN-SODIUM CHLORIDE 30-0.9 UT/500ML-% IV SOLN
2.5000 [IU]/h | INTRAVENOUS | Status: DC
  Filled 2019-11-05: qty 500

## 2019-11-05 MED ORDER — FENTANYL-BUPIVACAINE-NACL 0.5-0.125-0.9 MG/250ML-% EP SOLN
12.0000 mL/h | EPIDURAL | Status: DC | PRN
Start: 2019-11-05 — End: 2019-11-05

## 2019-11-05 MED ORDER — SOD CITRATE-CITRIC ACID 500-334 MG/5ML PO SOLN: 30.0000 mL | ORAL | Status: DC | PRN

## 2019-11-05 MED ORDER — ONDANSETRON HCL 4 MG PO TABS: 4.0000 mg | ORAL_TABLET | ORAL | Status: DC | PRN

## 2019-11-05 MED ORDER — FENTANYL-BUPIVACAINE-NACL 0.5-0.125-0.9 MG/250ML-% EP SOLN
12.0000 mL/h | EPIDURAL | Status: DC | PRN
  Filled 2019-11-05: qty 250

## 2019-11-05 MED ORDER — LACTATED RINGERS IV SOLN
500.0000 mL | Freq: Once | INTRAVENOUS | Status: AC
  Administered 2019-11-05: 500 mL via INTRAVENOUS

## 2019-11-05 MED ORDER — WITCH HAZEL-GLYCERIN EX PADS: 1.0000 "application " | MEDICATED_PAD | CUTANEOUS | Status: DC | PRN

## 2019-11-05 MED ORDER — ONDANSETRON HCL 4 MG/2ML IJ SOLN
4.0000 mg | Freq: Four times a day (QID) | INTRAMUSCULAR | Status: DC | PRN
  Administered 2019-11-05: 4 mg via INTRAVENOUS
  Filled 2019-11-05: qty 2

## 2019-11-05 MED ORDER — TERBUTALINE SULFATE 1 MG/ML IJ SOLN: 0.2500 mg | Freq: Once | INTRAMUSCULAR | Status: DC | PRN

## 2019-11-05 MED ORDER — MISOPROSTOL 50MCG HALF TABLET
50.0000 ug | ORAL_TABLET | Freq: Once | ORAL | Status: AC
  Administered 2019-11-05: 50 ug via ORAL
  Filled 2019-11-05: qty 1

## 2019-11-05 MED ORDER — OXYCODONE HCL 5 MG PO TABS
5.0000 mg | ORAL_TABLET | ORAL | Status: DC | PRN
  Administered 2019-11-06: 5 mg via ORAL
  Filled 2019-11-05: qty 1

## 2019-11-05 MED ORDER — PHENYLEPHRINE 40 MCG/ML (10ML) SYRINGE FOR IV PUSH (FOR BLOOD PRESSURE SUPPORT): 80.0000 ug | PREFILLED_SYRINGE | INTRAVENOUS | Status: DC | PRN

## 2019-11-05 MED ORDER — SODIUM CHLORIDE 0.9 % IV SOLN
5.0000 10*6.[IU] | Freq: Once | INTRAVENOUS | Status: AC
  Administered 2019-11-05: 5 10*6.[IU] via INTRAVENOUS
  Filled 2019-11-05: qty 5

## 2019-11-05 MED ORDER — EPHEDRINE 5 MG/ML INJ: 10.0000 mg | INTRAVENOUS | Status: DC | PRN

## 2019-11-05 MED ORDER — PENICILLIN G POT IN DEXTROSE 60000 UNIT/ML IV SOLN
3.0000 10*6.[IU] | INTRAVENOUS | Status: DC
  Administered 2019-11-05 (×2): 3 10*6.[IU] via INTRAVENOUS
  Filled 2019-11-05 (×3): qty 50

## 2019-11-05 MED ORDER — LACTATED RINGERS IV SOLN: 500.0000 mL | INTRAVENOUS | Status: DC | PRN

## 2019-11-05 MED ORDER — OXYTOCIN-SODIUM CHLORIDE 30-0.9 UT/500ML-% IV SOLN
1.0000 m[IU]/min | INTRAVENOUS | Status: DC
  Administered 2019-11-05: 2 m[IU]/min via INTRAVENOUS

## 2019-11-05 MED ORDER — MAGNESIUM HYDROXIDE 400 MG/5ML PO SUSP: 30.0000 mL | ORAL | Status: DC | PRN

## 2019-11-05 MED ORDER — LACTATED RINGERS IV SOLN: INTRAVENOUS | Status: DC

## 2019-11-05 MED ORDER — LIDOCAINE HCL (PF) 1 % IJ SOLN: 30.0000 mL | INTRAMUSCULAR | Status: DC | PRN

## 2019-11-05 MED ORDER — PRENATAL MULTIVITAMIN CH
1.0000 | ORAL_TABLET | Freq: Every day | ORAL | Status: DC
  Administered 2019-11-07: 1 via ORAL
  Filled 2019-11-05: qty 1

## 2019-11-05 MED ORDER — DIBUCAINE (PERIANAL) 1 % EX OINT: 1.0000 "application " | TOPICAL_OINTMENT | CUTANEOUS | Status: DC | PRN

## 2019-11-05 MED ORDER — SODIUM CHLORIDE (PF) 0.9 % IJ SOLN
INTRAMUSCULAR | Status: DC | PRN
  Administered 2019-11-05: 2 mL/h via EPIDURAL

## 2019-11-05 MED ORDER — DIPHENHYDRAMINE HCL 50 MG/ML IJ SOLN: 12.5000 mg | INTRAMUSCULAR | Status: DC | PRN

## 2019-11-05 MED ORDER — DIPHENHYDRAMINE HCL 25 MG PO CAPS: 25.0000 mg | ORAL_CAPSULE | Freq: Four times a day (QID) | ORAL | Status: DC | PRN

## 2019-11-05 MED ORDER — OXYTOCIN BOLUS FROM INFUSION
333.0000 mL | Freq: Once | INTRAVENOUS | Status: AC
  Administered 2019-11-05: 333 mL via INTRAVENOUS

## 2019-11-05 MED ORDER — COCONUT OIL OIL: 1.0000 "application " | TOPICAL_OIL | Status: DC | PRN

## 2019-11-05 NOTE — MAU Provider Note (Addendum)
Pt informed that the ultrasound is considered a limited OB ultrasound and is not intended to be a complete ultrasound exam.  Patient also informed that the ultrasound is not being completed with the intent of assessing for fetal or placental anomalies or any pelvic abnormalities.  Explained that the purpose of today's ultrasound is to assess for presentation.  Patient acknowledges the purpose of the exam and the limitations of the study.    Fetus is in the longitudinal Lie with vertex presentation.  FHR 130s, average variability + accels No decels   UCs q 2-3 min, mild  I saw her in office this week Her waterbirth class certificate was sent to her via email on her phone I viewed it The office staff imported it into media, though file is not easily viewed today, but I verify that I saw it I signed her consent with her on Tuesday 11/03/19  Seabron Spates, CNM

## 2019-11-05 NOTE — Progress Notes (Signed)
Note made in chart of PAC's on recent US.  Called to discuss plan for water birth with MFM Dr. Donalee Citrin. If normal NST no contraindications to water birth.  Reactive tracing currently, will cont with plan for water birth.

## 2019-11-05 NOTE — Progress Notes (Signed)
Labor Progress Note Sandra Farrell is a 34 y.o. [id number] at [redacted]w[redacted]d presented for SROM, s/p Cytotec x1  S:  Uncomfortable with ctx, not feeling pressure, feels better in tub, entered about 30 min ago.  O:  BP 104/67   Pulse 87   Temp 97.7 F (36.5 C) (Oral)   Resp 20   Ht 5\' 9"  (1.753 m)   Wt 112 kg   LMP 02/08/2019   SpO2 99%   BMI 36.48 kg/m  FHT by doppler 138-146 SVE: deferred  A/P: 34 y.o. [id number] [redacted]w[redacted]d  1. Labor: early active 2. FWB: intermittent auscultation reassuring 3. Pain: hydrotherapy  Anticipate labor progress and waterbirth.  Julianne Handler, CNM 2:32 PM

## 2019-11-05 NOTE — Progress Notes (Signed)
LABOR PROGRESS NOTE  Sandra Farrell is a 34 y.o. V5I4332 at [redacted]w[redacted]d  admitted for SROM.  Subjective: Comfortable w epidural  Objective: BP 127/68   Pulse 76   Temp 98.3 F (36.8 C) (Oral)   Resp 18   Ht 5\' 9"  (1.753 m)   Wt 112 kg   LMP 02/08/2019   SpO2 98%   BMI 36.48 kg/m  or  Vitals:   11/05/19 1710 11/05/19 1715 11/05/19 1720 11/05/19 1725  BP: 133/77 133/72 119/67 127/68  Pulse: 95 77 76 76  Resp:      Temp:      TempSrc:      SpO2: 98% 98%    Weight:      Height:         Dilation: 5.5 Effacement (%): 90 Station: -1 Presentation: Vertex Exam by:: Lesia Sago, RN FHT: baseline rate 130, moderate varibility, +acel, -decel Toco: irregular, q2-4 min  Labs: Lab Results  Component Value Date   WBC 7.3 11/05/2019   HGB 9.5 (L) 11/05/2019   HCT 31.1 (L) 11/05/2019   MCV 79.9 (L) 11/05/2019   PLT 229 11/05/2019    Patient Active Problem List   Diagnosis Date Noted  . Fetal arrhythmia affecting pregnancy, antepartum 10/26/2019  . Diastasis of rectus abdominis 07/28/2019  . Uterine fibroid in pregnancy 07/10/2019  . Alpha thalassemia silent carrier 06/22/2019  . GBS bacteriuria 05/01/2019  . Supervision of other normal pregnancy, antepartum 04/29/2019  . Sickle cell trait ([place]) 04/29/2019    Assessment / Plan: 34 y.o. [id number] at [redacted]w[redacted]d here for SROM.  Labor: initially desired water birth and given misoprostol with good progress from 2.5/50/-2 to 5.5/90/-1 currently. No longer desires water birth and now w epidural and comfortable. Discussed starting pitocin given approaching 12hr of ROM and she is amenable. Will monitor for short period, if contractions spacing will plan to start pitocin.  Fetal Wellbeing:  Cat I Pain Control:  epidural GBS: Positive, on penicillin Anticipated MOD:  SVD  Fibroid uterus: largest in LUS, 4cm at largest length.   Augustin Coupe, MD/[facility] OB Fellow  11/05/2019, 5:46 PM

## 2019-11-05 NOTE — Anesthesia Preprocedure Evaluation (Signed)
Anesthesia Evaluation  Patient identified by MRN, date of birth, ID band Patient awake    Reviewed: Allergy & Precautions, H&P , NPO status , Patient's Chart, lab work & pertinent test results, reviewed documented beta blocker date and time   Airway Mallampati: I  TM Distance: >3 FB Neck ROM: full    Dental no notable dental hx. (+) Teeth Intact, Dental Advisory Given   Pulmonary neg pulmonary ROS,    Pulmonary exam normal breath sounds clear to auscultation       Cardiovascular negative cardio ROS Normal cardiovascular exam Rhythm:regular Rate:Normal     Neuro/Psych  Neuromuscular disease negative psych ROS   GI/Hepatic negative GI ROS, Neg liver ROS,   Endo/Other  Morbid obesity  Renal/GU negative Renal ROS  negative genitourinary   Musculoskeletal   Abdominal   Peds  Hematology negative hematology ROS (+)   Anesthesia Other Findings   Reproductive/Obstetrics (+) Pregnancy                             Anesthesia Physical Anesthesia Plan  ASA: III  Anesthesia Plan: Epidural   Post-op Pain Management:    Induction:   PONV Risk Score and Plan:   Airway Management Planned:   Additional Equipment:   Intra-op Plan:   Post-operative Plan:   Informed Consent: I have reviewed the patients History and Physical, chart, labs and discussed the procedure including the risks, benefits and alternatives for the proposed anesthesia with the patient or authorized representative who has indicated his/her understanding and acceptance.     Dental Advisory Given  Plan Discussed with:   Anesthesia Plan Comments: (Labs checked- platelets confirmed with RN in room. Fetal heart tracing, per RN, reported to be stable enough for sitting procedure. Discussed epidural, and patient consents to the procedure:  included risk of possible headache,backache, failed block, allergic reaction, and nerve  injury. This patient was asked if she had any questions or concerns before the procedure started.)        Anesthesia Quick Evaluation

## 2019-11-05 NOTE — Discharge Summary (Addendum)
Postpartum Discharge Summary     Patient Name: Sandra Farrell DOB: 1985-06-06 MRN: [id number]  Date of admission: 11/05/2019 Delivery date:11/05/2019  Delivering provider: Clarnce Flock  Date of discharge: 11/07/2019  Admitting diagnosis: Labor and delivery, indication for care [O75.9] Intrauterine pregnancy: [redacted]w[redacted]d    Secondary diagnosis:  Active Problems:   Supervision of other normal pregnancy, antepartum   Sickle cell trait ([place]   GBS bacteriuria   Alpha thalassemia silent carrier   Uterine fibroid in pregnancy   Diastasis of rectus abdominis   Fetal arrhythmia affecting pregnancy, antepartum   H/O bilateral salpingectomy  Additional problems: none    Discharge diagnosis: Term Pregnancy Delivered                                              Post partum procedures: Postpartum bilateral salpingectomy Augmentation: Pitocin and Cytotec Complications: None  Hospital course: Onset of Labor With Vaginal Delivery      34y.o. yo [id number] admitted in Latent Labor on 11/05/2019. Patient had an uncomplicated labor course as follows: Patient arrived at 2.5 cm dilation and was induced initially with misoprostol. She progressed to 5.5 cm and was then started on pitocin after which she quickly progressed to complete.  Membrane Rupture Time/Date: 3:30 AM ,11/05/2019   Delivery Method:Vaginal, Spontaneous  Episiotomy: None  Lacerations:  None  Patient had an uncomplicated postpartum course.  She is ambulating, tolerating a regular diet, passing flatus, and urinating well. Patient is discharged home in stable condition on 11/07/19.  Newborn Data: Birth date:11/05/2019  Birth time:6:43 PM  Gender:Female  Living status:Living  Apgars:8 ,9  Weight:3396 g   Magnesium Sulfate received: No BMZ received: No Rhophylac:N/A MMR:N/A T-DaP: Declined Flu: No Transfusion:No  Physical exam  Vitals:   11/06/19 1425 11/06/19 1815 11/06/19 2325 11/07/19 0400  BP: 123/88 123/70  126/66 112/75  Pulse: 69 72 70 66  Resp: _0 Temp: 98.8 F (37.1 C)     TempSrc: Axillary Oral    SpO2: 98% 99% 98% 98%  Weight:      Height:       General: alert, cooperative and no distress Lochia: appropriate Uterine Fundus: firm Incision: N/A DVT Evaluation: No evidence of DVT seen on physical exam. Labs: Lab Results  Component Value Date   WBC 7.3 11/05/2019   HGB 9.5 (L) 11/05/2019   HCT 31.1 (L) 11/05/2019   MCV 79.9 (L) 11/05/2019   PLT 229 11/05/2019   CMP Latest Ref Rng & Units 09/17/2019  Glucose 70 - 99 mg/dL 161(H)  BUN 6 - 20 mg/dL <5(L)  Creatinine 0.44 - 1.00 mg/dL 0.66  Sodium 135 - 145 mmol/L 136  Potassium 3.5 - 5.1 mmol/L 4.1  Chloride 98 - 111 mmol/L 105  CO2 22 - 32 mmol/L 24  Calcium 8.9 - 10.3 mg/dL 8.8(L)  Total Protein 6.5 - 8.1 g/dL 6.3(L)  Total Bilirubin 0.3 - 1.2 mg/dL 0.6  Alkaline Phos 38 - 126 U/L 85  AST 15 - 41 U/L 28  ALT 0 - 44 U/L 23   Edinburgh Score: Edinburgh Postnatal Depression Scale Screening Tool 11/05/2019  I have been able to laugh and see the funny side of things. (No Data)     After visit meds:  Allergies as of 11/07/2019   No Known Allergies  Medication List     STOP taking these medications    metroNIDAZOLE 500 MG tablet Commonly known as: FLAGYL   ondansetron 4 MG disintegrating tablet Commonly known as: Zofran ODT   vitamin B-6 25 MG tablet Commonly known as: pyridOXINE       TAKE these medications    acetaminophen 325 MG tablet Commonly known as: Tylenol Take 2 tablets (650 mg total) by mouth every 4 (four) hours as needed (for pain scale < 4).   ibuprofen 600 MG tablet Commonly known as: ADVIL Take 1 tablet (600 mg total) by mouth every 6 (six) hours.   meclizine 25 MG tablet Commonly known as: ANTIVERT Take 1 tablet (25 mg total) by mouth 3 (three) times daily as needed for dizziness.   metoCLOPramide 5 MG tablet Commonly known as: Reglan Take 1 tablet (5 mg total) by  mouth every 6 (six) hours as needed (migraine).   multivitamin-prenatal 27-0.8 MG Tabs tablet Take 1 tablet by mouth daily at 12 noon.   oxyCODONE 5 MG immediate release tablet Commonly known as: Oxy IR/ROXICODONE Take 1 tablet (5 mg total) by mouth every 4 (four) hours as needed (pain scale 4-7).   pseudoephedrine 60 MG tablet Commonly known as: SUDAFED Take 1 tablet (60 mg total) by mouth every 6 (six) hours as needed for congestion.         Discharge home in stable condition Infant Feeding: Breast Infant Disposition:rooming in Discharge instruction: per After Visit Summary and Postpartum booklet. Activity: Advance as tolerated. Pelvic rest for 6 weeks.  Diet: routine diet Future Appointments: Future Appointments  Date Time Provider [place] [facility]  12/18/2019 10:00 AM Truett Mainland, DO [facility] None   Follow up Visit:    Please schedule this patient for a In person postpartum visit in 6 weeks with the following provider: Any provider. Additional Postpartum F/U:Incision check 1 week  Low risk pregnancy complicated by:  n/a Delivery mode:  Vaginal, Spontaneous  Anticipated Birth Control:   Postpartum bilateral salpingectomy performed   11/07/2019 Rise Patience, DO  GME ATTESTATION:  I saw and evaluated the patient. I agree with the findings and the plan of care as documented in the resident's note.  Merilyn Baba, DO OB Fellow, [facility] 11/07/2019 8:11 AM

## 2019-11-05 NOTE — Lactation Note (Signed)
This note was copied from a baby's chart. Lactation Consultation Note  Patient Name: Sandra Farrell Today's Date: 11/05/2019 Reason for consult: Initial assessment;1st time breastfeeding;Early term 37-38.6wks P3, 5 hour ETI female infant. Per mom, this is her first time trying BF she did not BF her other children.  Per mom, infant latched 2 minutes in L&D, 2nd time 5 minutes and this will be her 3rd time latching at the breast. LC discussed when mom BF infant to BF STS and not swaddle in blankets. Mom did breast stimulation and hand expressed a small amount of colostrum out of her breast prior to latching infant. Mom latched infant oh her left breast using the football hold position, infant latched with nose and chin touching breast, top lip flanged outward, infant was still BF for 17 minutes after LC left the room. Mom understands to BF infant according hunger cues, 8 to 12+ times within 24 hours and on demand. Mom will continue to do STS with infant. Mom knows to call RN or LC if she needs any assistance with latching infant at breast. Mom made aware of O/P services, breastfeeding support groups, community resources, and our phone # for post-discharge questions.    Maternal Data Formula Feeding for Exclusion: No Has patient been taught Hand Expression?: Yes Does the patient have breastfeeding experience prior to this delivery?: No  Feeding Feeding Type: Breast Fed  LATCH Score Latch: Grasps breast easily, tongue down, lips flanged, rhythmical sucking.  Audible Swallowing: Spontaneous and intermittent  Type of Nipple: Everted at rest and after stimulation  Comfort (Breast/Nipple): Soft / non-tender  Hold (Positioning): Assistance needed to correctly position infant at breast and maintain latch.  LATCH Score: 9  Interventions Interventions: Breast feeding basics reviewed;Assisted with latch;Breast compression;Skin to skin;Adjust position;Breast massage;Support  pillows;Hand express;Position options  Lactation Tools Discussed/Used WIC Program: No   Consult Status Consult Status: Follow-up Date: 11/06/19 Follow-up type: In-patient    Vicente Serene 11/05/2019, 11:48 PM

## 2019-11-05 NOTE — Anesthesia Procedure Notes (Signed)
Epidural Patient location during procedure: OB Start time: 11/05/2019 4:40 PM End time: 11/05/2019 4:45 PM  Staffing Anesthesiologist: Janeece Riggers, MD  Preanesthetic Checklist Completed: patient identified, IV checked, site marked, risks and benefits discussed, surgical consent, monitors and equipment checked, pre-op evaluation and timeout performed  Epidural Patient position: sitting Prep: DuraPrep and site prepped and draped Patient monitoring: continuous pulse ox and blood pressure Approach: midline Location: L3-L4 Injection technique: LOR air  Needle:  Needle type: Tuohy  Needle gauge: 17 G Needle length: 9 cm and 9 Needle insertion depth: 8 cm Catheter type: closed end flexible Catheter size: 19 Gauge Catheter at skin depth: 13 cm Test dose: negative  Assessment Events: blood not aspirated, injection not painful, no injection resistance, no paresthesia and negative IV test

## 2019-11-05 NOTE — H&P (Addendum)
OBSTETRIC ADMISSION HISTORY AND PHYSICAL  Sandra Farrell is a 34 y.o. female [phone] with IUP at [redacted]w[redacted]d presenting for SROM with few contractions, desiring water birth. She reports +FMs. No LOF, VB, blurry vision, headaches, peripheral edema, or RUQ pain. She plans on bottle feeding but using a pump. She requests BTL for birth control (papers signed 08/25/2019).  Dating: By 1st trimester [place] --->  Estimated Date of Delivery: 11/15/19  Sono:    @[redacted]w[redacted]d , normal anatomy, cephalic presentation, 26%VZC, EFW 1839g   Prenatal History/Complications: --Sickle Cell Trait --GBS bacteriuria --Alpha Thalassemia silent carrier --Uterine fibroid in pregnancy --Diastasis of Rectus Abdominus --Fetal arrhythmia affecting pregnancy, antepartum  Past Medical History: Past Medical History:  Diagnosis Date  . Fibroid   . Vaginal Pap smear, abnormal     Past Surgical History: Past Surgical History:  Procedure Laterality Date  . foot surgery right    . INDUCED ABORTION      Obstetrical History: OB History    Gravida  4   Para  2   Term  2   Preterm  0   AB  1   Living  2     SAB  0   TAB  1   Ectopic  0   Multiple  0   Live Births  2           Social History: Social History   Socioeconomic History  . Marital status: Single    Spouse name: Not on file  . Number of children: 2  . Years of education: Not on file  . Highest education level: High school graduate  Occupational History  . Occupation: unempolyed  Tobacco Use  . Smoking status: Never Smoker  . Smokeless tobacco: Never Used  Vaping Use  . Vaping Use: Never used  Substance and Sexual Activity  . Alcohol use: Not Currently    Comment: not in pregnancy  . Drug use: No  . Sexual activity: Yes  Other Topics Concern  . Not on file  Social History Narrative  . Not on file   Social Determinants of Health   Financial Resource Strain:   . Difficulty of Paying Living Expenses:   Food Insecurity:   .  Worried About Charity fundraiser in the Last Year:   . Arboriculturist in the Last Year:   Transportation Needs:   . Film/video editor (Medical):   Marland Kitchen Lack of Transportation (Non-Medical):   Physical Activity:   . Days of Exercise per Week:   . Minutes of Exercise per Session:   Stress:   . Feeling of Stress :   Social Connections:   . Frequency of Communication with Friends and Family:   . Frequency of Social Gatherings with Friends and Family:   . Attends Religious Services:   . Active Member of Clubs or Organizations:   . Attends Archivist Meetings:   Marland Kitchen Marital Status:     Family History: Family History  Problem Relation Age of Onset  . Hypertension Father   . Asthma Son   . Cancer Neg Hx   . Depression Neg Hx   . Stroke Neg Hx     Allergies: No Known Allergies  Medications Prior to Admission  Medication Sig Dispense Refill Last Dose  . meclizine (ANTIVERT) 25 MG tablet Take 1 tablet (25 mg total) by mouth 3 (three) times daily as needed for dizziness. 30 tablet 0 Past Month at Unknown time  . metoCLOPramide (REGLAN)  5 MG tablet Take 1 tablet (5 mg total) by mouth every 6 (six) hours as needed (migraine). 30 tablet 0 Past Month at Unknown time  . Prenatal Vit-Fe Fumarate-FA (MULTIVITAMIN-PRENATAL) 27-0.8 MG TABS tablet Take 1 tablet by mouth daily at 12 noon.   11/04/2019 at Unknown time  . pseudoephedrine (SUDAFED) 60 MG tablet Take 1 tablet (60 mg total) by mouth every 6 (six) hours as needed for congestion. 30 tablet 0 Past Month at Unknown time  . metroNIDAZOLE (FLAGYL) 500 MG tablet Take 1 tablet (500 mg total) by mouth 2 (two) times daily. (Patient not taking: Reported on 10/20/2019) 14 tablet 0 Completed Course at Unknown time  . ondansetron (ZOFRAN ODT) 4 MG disintegrating tablet Take 1 tablet (4 mg total) by mouth every 6 (six) hours as needed for nausea. (Patient not taking: Reported on 11/05/2019) 30 tablet 4 Not Taking at Unknown time  . vitamin B-6  (PYRIDOXINE) 25 MG tablet Take 1 tablet (25 mg total) by mouth every 6 (six) hours. (Patient not taking: Reported on 07/28/2019) 180 tablet 3 Not Taking at Unknown time     Review of Systems:  All systems reviewed and negative except as stated in HPI  PE: Blood pressure 123/74, pulse 67, temperature 98.1 F (36.7 C), temperature source Axillary, resp. rate 17, height 5\' 9"  (1.753 m), weight 112 kg, last menstrual period 02/08/2019, SpO2 99 %. General appearance: alert, cooperative, appears stated age and no distress Lungs: regular rate and effort Heart: regular rate  Abdomen: soft, non-tender Extremities: Homans sign is negative, no sign of DVT Presentation: cephalic EFM: 258 bpm, moderate variability, present accels, no decels Toco: q2-69min Dilation: 2.5 Effacement (%): 50 Station: -2 Exam by:: Lesia Sago, RN   Prenatal labs: ABO, Rh: --/--/O POS (07/08 5277) Antibody: NEG (07/08 0716) Rubella: 1.12 (12/30 1510) RPR: Non Reactive (03/30 0907)  HBsAg: Negative (12/30 1510)  HIV: Non Reactive (03/30 0907)  GBS: Positive/-- (12/30 0000) positive 2 hr GTT: Normal  Prenatal Transfer Tool  Maternal Diabetes: No Genetic Screening: Declined Maternal Ultrasounds/Referrals: Normal Fetal Ultrasounds or other Referrals:  None Maternal Substance Abuse:  No Significant Maternal Medications:  None Significant Maternal Lab Results: Group B Strep positive  Results for orders placed or performed during the hospital encounter of 11/05/19 (from the past 24 hour(s))  POCT fern test   Collection Time: 11/05/19  7:06 AM  Result Value Ref Range   POCT Fern Test Positive = ruptured amniotic membanes   CBC   Collection Time: 11/05/19  7:16 AM  Result Value Ref Range   WBC 7.3 4.0 - 10.5 K/uL   RBC 3.89 3.87 - 5.11 MIL/uL   Hemoglobin 9.5 (L) 12.0 - 15.0 g/dL   HCT 31.1 (L) 36 - 46 %   MCV 79.9 (L) 80.0 - 100.0 fL   MCH 24.4 (L) 26.0 - 34.0 pg   MCHC 30.5 30.0 - 36.0 g/dL   RDW 15.9 (H)  11.5 - 15.5 %   Platelets 229 150 - 400 K/uL   nRBC 0.0 0.0 - 0.2 %  Type and screen [place]   Collection Time: 11/05/19  7:16 AM  Result Value Ref Range   ABO/RH(D) O POS    Antibody Screen NEG    Sample Expiration      11/08/2019,2359 Performed at [facility], [address], [place], [place] [address]   SARS Coronavirus 2 by RT PCR (hospital order, performed in [facility] hospital lab) Nasopharyngeal Nasopharyngeal Swab  Collection Time: 11/05/19  7:26 AM   Specimen: Nasopharyngeal Swab  Result Value Ref Range   SARS Coronavirus 2 NEGATIVE NEGATIVE    Patient Active Problem List   Diagnosis Date Noted  . Fetal arrhythmia affecting pregnancy, antepartum 10/26/2019  . Diastasis of rectus abdominis 07/28/2019  . Uterine fibroid in pregnancy 07/10/2019  . Alpha thalassemia silent carrier 06/22/2019  . GBS bacteriuria 05/01/2019  . Supervision of other normal pregnancy, antepartum 04/29/2019  . Sickle cell trait ([place]) 04/29/2019    Assessment/Plan: Sandra Farrell is a 34 y.o. [id number] at [redacted]w[redacted]d here for SROM with expected VD with water birth preference.   1. Labor: vertex by bedside US. Period of expectant management without significant progress, still with some thickness to cervix will give misoprostol.  2. FWB: Cat 1 3. Pain: per patient request 4. GBS: positive, PCN    Rise Patience, DO PGY-1  PGY1, Family Medicine 11/05/2019, 11:06 AM    OB FELLOW ATTESTATION  I have seen and examined this patient and edited the above documentation in the resident's note to reflect any changes or updates.  Augustin Coupe, MD/[facility] OB Fellow  11/05/2019, 11:10 AM

## 2019-11-05 NOTE — MAU Note (Signed)
Leaking cl fld since 0330. Had first ctx in MAU lobby. 1cm on Tuesday. Desires water birth and has attended classes

## 2019-11-06 ENCOUNTER — Encounter (HOSPITAL_COMMUNITY): Payer: Self-pay | Admitting: Obstetrics & Gynecology

## 2019-11-06 ENCOUNTER — Inpatient Hospital Stay (HOSPITAL_COMMUNITY): Payer: Medicaid Other | Admitting: Anesthesiology

## 2019-11-06 ENCOUNTER — Encounter (HOSPITAL_COMMUNITY)
Admission: AD | Disposition: A | Discharge status: Home / Self Care | Payer: Self-pay | Source: Home / Self Care | Attending: Obstetrics & Gynecology

## 2019-11-06 DIAGNOSIS — Z9079 Acquired absence of other genital organ(s): Secondary | ICD-10-CM

## 2019-11-06 DIAGNOSIS — Z302 Encounter for sterilization: Secondary | ICD-10-CM

## 2019-11-06 HISTORY — PX: TUBAL LIGATION: SHX77

## 2019-11-06 SURGERY — LIGATION, FALLOPIAN TUBE, POSTPARTUM
Anesthesia: Epidural | Laterality: Bilateral

## 2019-11-06 MED ORDER — ONDANSETRON HCL 4 MG/2ML IJ SOLN
INTRAMUSCULAR | Status: DC | PRN
  Administered 2019-11-06: 4 mg via INTRAVENOUS

## 2019-11-06 MED ORDER — FENTANYL CITRATE (PF) 100 MCG/2ML IJ SOLN
INTRAMUSCULAR | Status: AC
  Filled 2019-11-06: qty 2

## 2019-11-06 MED ORDER — LACTATED RINGERS IV SOLN: INTRAVENOUS | Status: DC

## 2019-11-06 MED ORDER — MEPERIDINE HCL 25 MG/ML IJ SOLN: 6.2500 mg | INTRAMUSCULAR | Status: DC | PRN

## 2019-11-06 MED ORDER — LIDOCAINE-EPINEPHRINE (PF) 2 %-1:200000 IJ SOLN
INTRAMUSCULAR | Status: DC | PRN
  Administered 2019-11-06: 3 mL via INTRADERMAL
  Administered 2019-11-06: 5 mL via INTRADERMAL
  Administered 2019-11-06: 2 mL via INTRADERMAL
  Administered 2019-11-06 (×2): 5 mL via INTRADERMAL

## 2019-11-06 MED ORDER — FAMOTIDINE 20 MG PO TABS
40.0000 mg | ORAL_TABLET | Freq: Once | ORAL | Status: AC
  Administered 2019-11-06: 40 mg via ORAL
  Filled 2019-11-06: qty 2

## 2019-11-06 MED ORDER — KETOROLAC TROMETHAMINE 30 MG/ML IJ SOLN
INTRAMUSCULAR | Status: AC
  Filled 2019-11-06: qty 1

## 2019-11-06 MED ORDER — HYDROMORPHONE HCL 1 MG/ML IJ SOLN: 0.2500 mg | INTRAMUSCULAR | Status: DC | PRN

## 2019-11-06 MED ORDER — KETOROLAC TROMETHAMINE 30 MG/ML IJ SOLN
INTRAMUSCULAR | Status: DC | PRN
  Administered 2019-11-06: 30 mg via INTRAVENOUS

## 2019-11-06 MED ORDER — ONDANSETRON HCL 4 MG/2ML IJ SOLN
INTRAMUSCULAR | Status: AC
  Filled 2019-11-06: qty 2

## 2019-11-06 MED ORDER — SODIUM CHLORIDE 0.9 % IR SOLN
Status: DC | PRN
  Administered 2019-11-06: 1

## 2019-11-06 MED ORDER — FENTANYL CITRATE (PF) 100 MCG/2ML IJ SOLN
INTRAMUSCULAR | Status: DC | PRN
  Administered 2019-11-06: 100 ug via INTRAVENOUS

## 2019-11-06 MED ORDER — METOCLOPRAMIDE HCL 10 MG PO TABS
10.0000 mg | ORAL_TABLET | Freq: Once | ORAL | Status: AC
  Administered 2019-11-06: 10 mg via ORAL
  Filled 2019-11-06: qty 1

## 2019-11-06 MED ORDER — BUPIVACAINE HCL (PF) 0.25 % IJ SOLN
INTRAMUSCULAR | Status: DC | PRN
  Administered 2019-11-06: 10 mL

## 2019-11-06 MED ORDER — LACTATED RINGERS IV SOLN
INTRAVENOUS | Status: DC | PRN
Start: 2019-11-06 — End: 2019-11-06

## 2019-11-06 MED ORDER — KETOROLAC TROMETHAMINE 30 MG/ML IJ SOLN
30.0000 mg | Freq: Once | INTRAMUSCULAR | Status: AC | PRN
  Administered 2019-11-06: 30 mg via INTRAVENOUS

## 2019-11-06 MED ORDER — PROMETHAZINE HCL 25 MG/ML IJ SOLN: 6.2500 mg | INTRAMUSCULAR | Status: DC | PRN

## 2019-11-06 MED ORDER — BUPIVACAINE HCL (PF) 0.25 % IJ SOLN
INTRAMUSCULAR | Status: AC
  Filled 2019-11-06: qty 10

## 2019-11-06 SURGICAL SUPPLY — 21 items
CLOTH BEACON ORANGE TIMEOUT ST (SAFETY) ×2 IMPLANT
DECANTER SPIKE VIAL GLASS SM (MISCELLANEOUS) ×1 IMPLANT
DRSG OPSITE POSTOP 3X4 (GAUZE/BANDAGES/DRESSINGS) ×2 IMPLANT
DURAPREP 26ML APPLICATOR (WOUND CARE) ×2 IMPLANT
GLOVE BIOGEL PI IND STRL 7.0 (GLOVE) ×2 IMPLANT
GLOVE BIOGEL PI INDICATOR 7.0 (GLOVE) ×2
GLOVE ECLIPSE 7.0 STRL STRAW (GLOVE) ×4 IMPLANT
GOWN STRL REUS W/TWL LRG LVL3 (GOWN DISPOSABLE) ×4 IMPLANT
NEEDLE HYPO 22GX1.5 SAFETY (NEEDLE) ×2 IMPLANT
NS IRRIG 1000ML POUR BTL (IV SOLUTION) ×2 IMPLANT
PACK ABDOMINAL MINOR (CUSTOM PROCEDURE TRAY) ×2 IMPLANT
PROTECTOR NERVE ULNAR (MISCELLANEOUS) ×2 IMPLANT
SPONGE LAP 4X18 RFD (DISPOSABLE) IMPLANT
SUT MON AB-0 CT1 36 (SUTURE) IMPLANT
SUT VIC AB 0 CT1 27 (SUTURE) ×2
SUT VIC AB 0 CT1 27XBRD ANBCTR (SUTURE) ×1 IMPLANT
SUT VICRYL 4-0 PS2 18IN ABS (SUTURE) ×2 IMPLANT
SYR CONTROL 10ML LL (SYRINGE) ×2 IMPLANT
TOWEL OR 17X24 6PK STRL BLUE (TOWEL DISPOSABLE) ×4 IMPLANT
TRAY FOLEY CATH SILVER 14FR (SET/KITS/TRAYS/PACK) ×1 IMPLANT
WATER STERILE IRR 1000ML POUR (IV SOLUTION) ×2 IMPLANT

## 2019-11-06 NOTE — Anesthesia Postprocedure Evaluation (Signed)
Anesthesia Post Note  Patient: Sandra Farrell  Procedure(s) Performed: POST PARTUM TUBAL LIGATION (Bilateral )     Patient location during evaluation: PACU Anesthesia Type: Epidural Level of consciousness: awake Pain management: pain level controlled Vital Signs Assessment: post-procedure vital signs reviewed and stable Respiratory status: spontaneous breathing Cardiovascular status: stable Postop Assessment: no headache, no backache, epidural receding, patient able to bend at knees and no apparent nausea or vomiting Anesthetic complications: no   No complications documented.  Last Vitals:  Vitals:   11/06/19 1229 11/06/19 1230  BP:  114/70  Pulse: 86 74  Resp: (!) 21 17  Temp:    SpO2: 95% 96%    Last Pain:  Vitals:   11/06/19 1230  TempSrc:   PainSc: 0-No pain   Pain Goal:    LLE Motor Response: Purposeful movement (11/06/19 1215)   RLE Motor Response: Purposeful movement (11/06/19 1215)       Epidural/Spinal Function Cutaneous sensation: Able to Discern Pressure (11/06/19 1230), Patient able to flex knees: Yes (11/06/19 1230), Patient able to lift hips off bed: No (11/06/19 1230), Back pain beyond tenderness at insertion site: No (11/06/19 1230), Progressively worsening motor and/or sensory loss: No (11/06/19 1230), Bowel and/or bladder incontinence post epidural: No (11/06/19 1230)  Huston Foley

## 2019-11-06 NOTE — Anesthesia Postprocedure Evaluation (Signed)
Anesthesia Post Note  Patient: Sandra Farrell  Procedure(s) Performed: AN AD HOC LABOR EPIDURAL     Patient location during evaluation: Mother Baby Anesthesia Type: Epidural Level of consciousness: awake and alert, oriented and patient cooperative Pain management: pain level controlled Vital Signs Assessment: post-procedure vital signs reviewed and stable Respiratory status: spontaneous breathing Cardiovascular status: stable Postop Assessment: no headache, epidural receding, patient able to bend at knees and no signs of nausea or vomiting Anesthetic complications: no Comments: Pt. States she is walking.  Pain score 2.    No complications documented.  Last Vitals:  Vitals:   11/06/19 0149 11/06/19 0501  BP: 116/63 126/87  Pulse: 70 65  Resp: 18 18  Temp: 36.9 C 36.8 C  SpO2: 97% 97%    Last Pain:  Vitals:   11/06/19 0501  TempSrc: Oral  PainSc: 4    Pain Goal:                   [facility]

## 2019-11-06 NOTE — Progress Notes (Signed)
Patient desires permanent sterilization.  Other reversible forms of contraception were discussed with patient; she declines all other modalities. Risks of procedure discussed with patient including but not limited to: risk of regret, permanence of method, bleeding, infection, injury to surrounding organs and need for additional procedures.  Failure risk of about 1% with increased risk of ectopic gestation if pregnancy occurs was also discussed with patient.  Also discussed possibility of post-tubal pain syndrome. The patient concurred with the proposed plan, giving informed written consent for the procedures.  Patient has been NPO since midnight, she will remain NPO for procedure. Anesthesia and OR aware.

## 2019-11-06 NOTE — Transfer of Care (Signed)
Immediate Anesthesia Transfer of Care Note  Patient: Sandra Farrell  Procedure(s) Performed: POST PARTUM TUBAL LIGATION (Bilateral )  Patient Location: PACU  Anesthesia Type:Epidural  Level of Consciousness: awake and alert   Airway & Oxygen Therapy: Patient Spontanous Breathing  Post-op Assessment: Report given to RN and Post -op Vital signs reviewed and stable  Post vital signs: Reviewed  Last Vitals:  Vitals Value Taken Time  BP 87/49 11/06/19 1153  Temp    Pulse 72 11/06/19 1155  Resp 7 11/06/19 1155  SpO2 95 % 11/06/19 1155  Vitals shown include unvalidated device data.  Last Pain:  Vitals:   11/06/19 1025  TempSrc: Oral  PainSc:          Complications: No complications documented.

## 2019-11-06 NOTE — Op Note (Signed)
Sheala A Decoteau 11/06/2019  PREOPERATIVE DIAGNOSIS:  Undesired fertility, multiparity  POSTOPERATIVE DIAGNOSIS:  The same  PROCEDURE:  Postpartum Bilateral Salpingectomy   SURGEON:  Dr Darron Doom  ASSISTANT: Dr. Clayton Lefort  ANESTHESIA:  Epidural  COMPLICATIONS:  None immediate.  ESTIMATED BLOOD LOSS:  Less than 20cc.  FLUIDS: 500 mL LR.  URINE OUTPUT:  N/A, no foley placed  INDICATIONS: 34 y.o. yo [id number]  with undesired fertility,status post vaginal delivery, desires permanent sterilization. Risks and benefits of procedure discussed with patient including permanence of method, bleeding, infection, injury to surrounding organs and need for additional procedures. Risk failure of 0.5-1% with increased risk of ectopic gestation if pregnancy occurs was also discussed with patient.   FINDINGS:  Normal uterus, tubes, and ovaries.  TECHNIQUE:  The patient was taken to the operating room where her epidural anesthesia was dosed up to surgical level and found to be adequate.  She was then placed in the dorsal supine position and prepped and draped in sterile fashion.  After an adequate timeout was performed, attention was turned to the patient's abdomen where a small transverse skin incision was made under the umbilical fold. The incision was taken down to the layer of fascia using the scalpel, and fascia was incised, and extended bilaterally. The peritoneum was entered in a sharp fashion. Attention was then turned to the patient's uterus, and left fallopian tube was identified and followed out to the fimbriated end.    A Kelly clamp was placed across the left fallopian tube taking care to incorporate the fimbriae. A second clamp was then placed below the first. The fallopian tube was then removed with Metzenbaum scissors. The pedicle was then ligated with 0 Monocryl suture and the second clamp was removed with excellent hemostasis noted. Then a second ligature of 0 Monocryl suture was  placed below the remaining clamp, the clamp was then removed and again excellent hemostasis was observed.   A similar process was then carried out on the right fallopain tube allowing for bilateral tubal sterilization.   Good hemostasis was noted overall. The fascial incision was repaired with 0 Vicryl and a remaining portion was used to close the subcutaneous space. The skin was closed with a 4-0 Vicryl subcuticular stitch. The patient tolerated the procedure well.  Sponge, lap, and needle counts were correct times two.  The patient was then taken to the recovery room awake, extubated and in stable condition.   Clarnce Flock, MD 11/06/2019 11:56 AM

## 2019-11-06 NOTE — Anesthesia Preprocedure Evaluation (Signed)
Anesthesia Evaluation  Patient identified by MRN, date of birth, ID band Patient awake    Reviewed: Allergy & Precautions, H&P , NPO status , Patient's Chart, lab work & pertinent test results, reviewed documented beta blocker date and time   Airway Mallampati: I  TM Distance: >3 FB Neck ROM: full    Dental no notable dental hx. (+) Teeth Intact, Dental Advisory Given   Pulmonary neg pulmonary ROS,    Pulmonary exam normal breath sounds clear to auscultation       Cardiovascular negative cardio ROS Normal cardiovascular exam Rhythm:regular Rate:Normal     Neuro/Psych  Neuromuscular disease negative psych ROS   GI/Hepatic negative GI ROS, Neg liver ROS,   Endo/Other    Renal/GU negative Renal ROS  negative genitourinary   Musculoskeletal   Abdominal (+) + obese,   Peds  Hematology negative hematology ROS (+)   Anesthesia Other Findings   Reproductive/Obstetrics (+) Pregnancy                             Anesthesia Physical  Anesthesia Plan  ASA: II  Anesthesia Plan: Epidural   Post-op Pain Management:    Induction:   PONV Risk Score and Plan: 3 and Ondansetron, Dexamethasone and Midazolam  Airway Management Planned: Natural Airway and Nasal Cannula  Additional Equipment: None  Intra-op Plan:   Post-operative Plan:   Informed Consent: I have reviewed the patients History and Physical, chart, labs and discussed the procedure including the risks, benefits and alternatives for the proposed anesthesia with the patient or authorized representative who has indicated his/her understanding and acceptance.       Plan Discussed with:   Anesthesia Plan Comments:         Anesthesia Quick Evaluation

## 2019-11-06 NOTE — Progress Notes (Addendum)
POSTPARTUM PROGRESS NOTE  Subjective: Sandra Farrell is a 34 y.o. [id number] s/p SVD at [redacted]w[redacted]d.  She reports she doing well. No acute events overnight. She denies any problems with ambulating, voiding or po intake. Denies nausea or vomiting. She has  passed flatus. Pain is well controlled.  Lochia is appropriate.  Objective: Blood pressure 126/87, pulse 65, temperature 98.3 F (36.8 C), temperature source Oral, resp. rate 18, height 5\' 9"  (1.753 m), weight 112 kg, last menstrual period 02/08/2019, SpO2 97 %, unknown if currently breastfeeding.  Physical Exam:  General: alert, cooperative and no distress Chest: no respiratory distress Abdomen: soft, non-tender  Uterine Fundus: firm, appropriately tender Extremities: No calf swelling or tenderness  no edema  Recent Labs    11/05/19 0716  HGB 9.5*  HCT 31.1*    Assessment/Plan: Sandra Farrell is a 34 y.o. [id number] s/p SVD at [redacted]w[redacted]d for SROM.  Routine Postpartum Care: Doing well, pain well-controlled.  -- Continue routine care, lactation support  -- Contraception: BTL--scheduled for this morning -- Feeding: breast, currently having to supplement with bottle   Dispo: Plan for discharge today or tomorrow if baby is cleared for discharge.  Rise Patience, DO PGY1 Family Medicine Resident

## 2019-11-07 MED ORDER — IBUPROFEN 600 MG PO TABS: 600.0000 mg | ORAL_TABLET | Freq: Four times a day (QID) | ORAL | 0 refills | Status: DC

## 2019-11-07 MED ORDER — OXYCODONE HCL 5 MG PO TABS: 5.0000 mg | ORAL_TABLET | ORAL | 0 refills | Status: DC | PRN

## 2019-11-07 MED ORDER — ACETAMINOPHEN 325 MG PO TABS: 650.0000 mg | ORAL_TABLET | ORAL | 0 refills | Status: DC | PRN

## 2019-11-07 NOTE — Discharge Instructions (Signed)

## 2019-11-07 NOTE — Lactation Note (Signed)
This note was copied from a baby's chart. Lactation Consultation Note  Patient Name: Sandra Farrell [id number] Date: 11/07/2019 Reason for consult: Follow-up assessment;Early term 37-38.6wks  Mom's first time breastfeeding.She is currently doing both breastfeeding and supplementing with donor milk. Mom given education on how to prevent engorgement. She demonstrated a latch and was successful in football hold on the left breast. Baby just there for comfort since she had her last bottle at 11;30 am. Mom has a pump at home and was instructed to pump 15- 20 minutes after feeding every 3 hours. She has a f/u appointment on Monday.     Maternal Data    Feeding Feeding Type: Donor Breast Milk Nipple Type: Extra Slow Flow  LATCH Score                   Interventions    Lactation Tools Discussed/Used     Consult Status Consult Status: Complete Date: 11/07/19    Idalee Foxworthy  Nicholson-Springer 11/07/2019, 9:21 AM

## 2019-11-09 ENCOUNTER — Encounter: Payer: Medicaid Other | Admitting: Family Medicine

## 2019-11-09 ENCOUNTER — Telehealth: Payer: Self-pay

## 2019-11-09 LAB — SURGICAL PATHOLOGY

## 2019-11-09 NOTE — Telephone Encounter (Signed)
Pt called stating her left leg is swollen and she noticed an indentation in her skin when she touched it. Advised keep leg elevated and increase fluids.   Terryon Pineiro l Roselene Gray, CMA

## 2019-11-15 ENCOUNTER — Inpatient Hospital Stay (HOSPITAL_COMMUNITY): Admit: 2019-11-15 | Payer: Self-pay

## 2019-12-18 ENCOUNTER — Encounter: Payer: Self-pay | Admitting: Family Medicine

## 2019-12-18 ENCOUNTER — Ambulatory Visit (INDEPENDENT_AMBULATORY_CARE_PROVIDER_SITE_OTHER): Payer: Medicaid Other | Admitting: Family Medicine

## 2019-12-18 ENCOUNTER — Other Ambulatory Visit: Payer: Self-pay

## 2019-12-18 DIAGNOSIS — M545 Low back pain, unspecified: Secondary | ICD-10-CM

## 2019-12-18 DIAGNOSIS — M9904 Segmental and somatic dysfunction of sacral region: Secondary | ICD-10-CM

## 2019-12-18 DIAGNOSIS — M9903 Segmental and somatic dysfunction of lumbar region: Secondary | ICD-10-CM

## 2019-12-18 DIAGNOSIS — M9905 Segmental and somatic dysfunction of pelvic region: Secondary | ICD-10-CM

## 2019-12-18 NOTE — Progress Notes (Signed)
[facility] Partum Visit Note  Kaysa Roulhac Wachsmuth is a 34 y.o. [phone] female who presents for a postpartum visit. She is 4 weeks postpartum following a normal spontaneous vaginal delivery.  I have fully reviewed the prenatal and intrapartum course. The delivery was at 72 gestational weeks.  Anesthesia: epidural. Postpartum course has been normal. Baby is doing well. Baby is feeding by both breast and bottle - unsure. Bleeding no bleeding. Bowel function is normal. Bladder function is normal. Patient is not sexually active. Contraception method is tubal ligation. Postpartum depression screening: negative.  Having some back pain, right sided, for 2 weeks.    The pregnancy intention screening data noted above was reviewed. Potential methods of contraception were discussed. The patient elected to proceed with Female Sterilization.    Edinburgh Postnatal Depression Scale - 12/18/19 1024      Edinburgh Postnatal Depression Scale:  In the Past 7 Days   I have been able to laugh and see the funny side of things. 0    I have looked forward with enjoyment to things. 0    I have blamed myself unnecessarily when things went wrong. 0    I have been anxious or worried for no good reason. 0    I have felt scared or panicky for no good reason. 0    Things have been getting on top of me. 0    I have been so unhappy that I have had difficulty sleeping. 0    I have felt sad or miserable. 1    I have been so unhappy that I have been crying. 0    The thought of harming myself has occurred to me. 0    Edinburgh Postnatal Depression Scale Total 1            The following portions of the patient's history were reviewed and updated as appropriate: allergies, current medications, past family history, past medical history, past social history, past surgical history and problem list.  Review of Systems Pertinent items are noted in HPI.    Objective:  Blood pressure (!) 128/92, pulse 69, height 5\' 9"  (1.753  m), weight 232 lb (105.2 kg), last menstrual period 02/08/2019, not currently breastfeeding.  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 Pap smear not done at today's visit.   Plan:   Essential components of care per ACOG recommendations:  1.  Mood and well being: Patient with negative depression screening today. Reviewed local resources for support.  - Patient does use tobacco.  - hx of drug use? No    2. Infant care and feeding:  -Patient currently breastmilk feeding? Yes  -Social determinants of health (SDOH) reviewed in EPIC. No concerns  3. Sexuality, contraception and birth spacing - Patient does not want a pregnancy in the next year.  - Reviewed forms of contraception in tiered fashion. Patient s/p salpingectomy  4. Sleep and fatigue -Encouraged family/partner/community support of 4 hrs of uninterrupted sleep to help with mood and fatigue  5. Physical Recovery  - Discussed patients delivery and complications - Patient has urinary incontinence? No  -  Patient is safe to resume physical and sexual activity  6.  Health Maintenance - Last pap smear done 03/2019 and was normal with negative HPV.  7. Back Pain 8. S/d Lumbar, sacrum, pelvis OMT done after patient permission. HVLA technique utilized. 3 areas treated with improvement of tissue texture and joint mobility. Patient tolerated procedure well.   - PCP follow up  [facility]

## 2020-01-11 ENCOUNTER — Other Ambulatory Visit: Payer: Self-pay

## 2020-01-11 ENCOUNTER — Encounter: Payer: Self-pay | Admitting: Family Medicine

## 2020-01-11 ENCOUNTER — Ambulatory Visit (INDEPENDENT_AMBULATORY_CARE_PROVIDER_SITE_OTHER): Payer: Medicaid Other | Admitting: Family Medicine

## 2020-01-11 VITALS — BP 141/95 | HR 70 | Wt 240.0 lb

## 2020-01-11 DIAGNOSIS — M545 Low back pain, unspecified: Secondary | ICD-10-CM

## 2020-01-11 DIAGNOSIS — M9904 Segmental and somatic dysfunction of sacral region: Secondary | ICD-10-CM | POA: Diagnosis not present

## 2020-01-11 DIAGNOSIS — M9905 Segmental and somatic dysfunction of pelvic region: Secondary | ICD-10-CM

## 2020-01-11 DIAGNOSIS — M9903 Segmental and somatic dysfunction of lumbar region: Secondary | ICD-10-CM

## 2020-01-11 NOTE — Progress Notes (Signed)
° °  Subjective:    Patient ID: Sandra Farrell, female    DOB: 12-06-1985, 34 y.o.   MRN: [id number]  HPI Patient see for follow up of back pain. Improved after manipulation. Still a little tight if sits for too long.   Review of Systems     Objective:   Physical Exam Vitals reviewed.  Constitutional:      Appearance: Normal appearance.  Musculoskeletal:     Comments: OSE: L1 ESRR, L5 ESRL. L/L torsion. Right ant innom    Skin:    Capillary Refill: Capillary refill takes less than 2 seconds.  Neurological:     General: No focal deficit present.     Mental Status: She is alert.  Psychiatric:        Mood and Affect: Mood normal.        Thought Content: Thought content normal.        Judgment: Judgment normal.       Assessment & Plan:  1. Lumbar back pain 2. Somatic dysfunction of lumbar region 3. Somatic dysfunction of pelvis region 4. Somatic dysfunction of sacral region OMT done after patient permission. HVLA technique utilized. 3 areas treated with improvement of tissue texture and joint mobility. Patient tolerated procedure well.

## 2020-03-21 ENCOUNTER — Other Ambulatory Visit: Payer: Self-pay

## 2020-03-21 MED ORDER — METRONIDAZOLE 500 MG PO TABS
500.0000 mg | ORAL_TABLET | Freq: Two times a day (BID) | ORAL | 0 refills | Status: DC
Start: 2020-03-21 — End: 2021-04-28

## 2020-03-31 DIAGNOSIS — H5213 Myopia, bilateral: Secondary | ICD-10-CM | POA: Diagnosis not present

## 2020-04-11 ENCOUNTER — Encounter: Payer: Self-pay | Admitting: General Practice

## 2020-05-13 DIAGNOSIS — Z1152 Encounter for screening for COVID-19: Secondary | ICD-10-CM | POA: Diagnosis not present

## 2020-05-27 DIAGNOSIS — Z1152 Encounter for screening for COVID-19: Secondary | ICD-10-CM | POA: Diagnosis not present

## 2020-07-11 IMAGING — US US OB LIMITED
1 series · 15 of 19 positions shown · non-contrast
Comparison: none

[Series 1: us ob limited · 19 acquisitions, 15 frames shown]
[im 1/19]
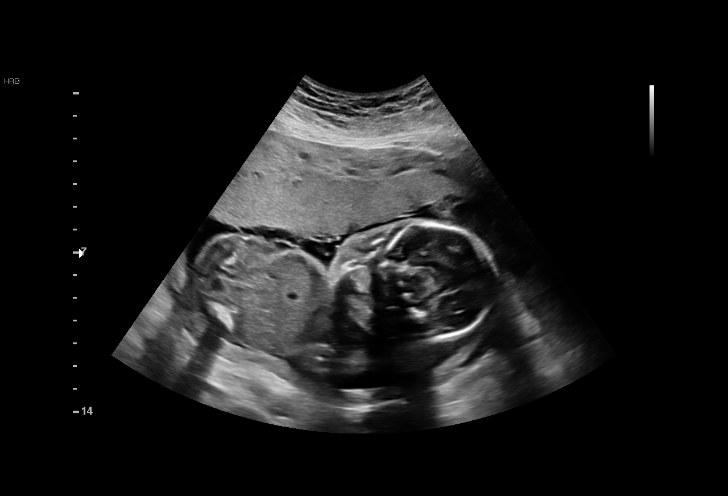
[im 2/19]
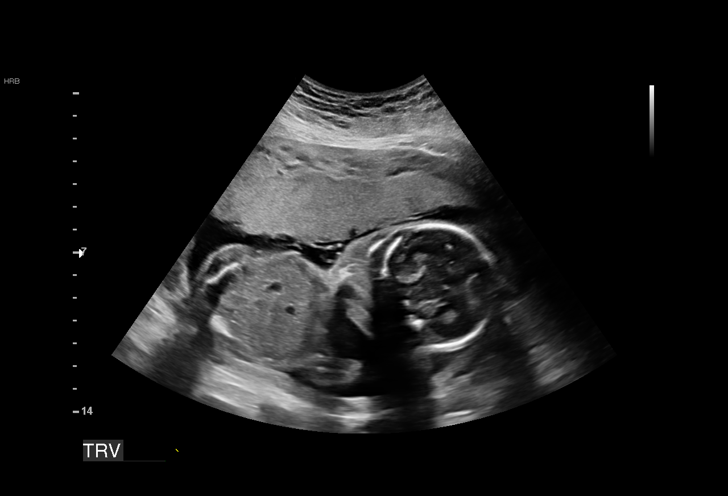
[im 4/19]
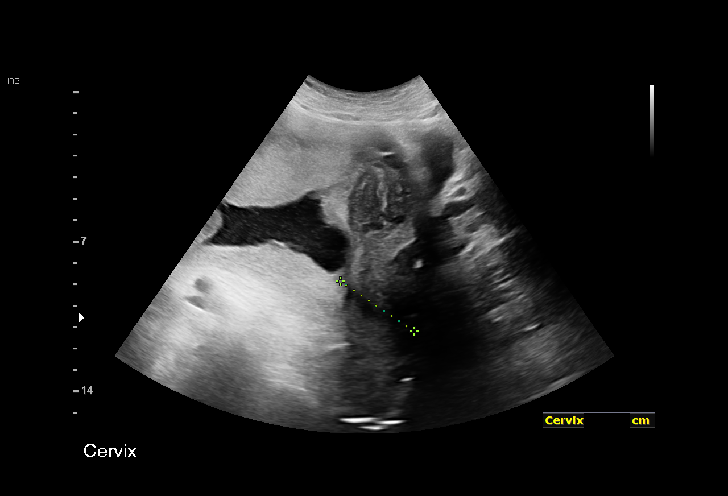
[im 5/19]
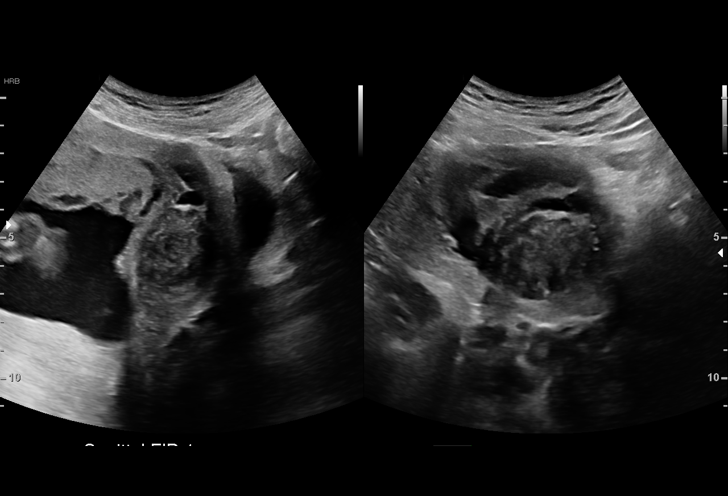
[im 6/19]
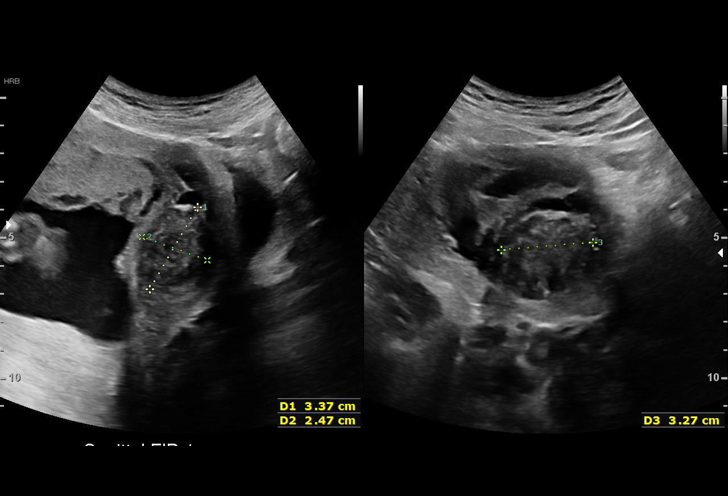
[im 7/19]
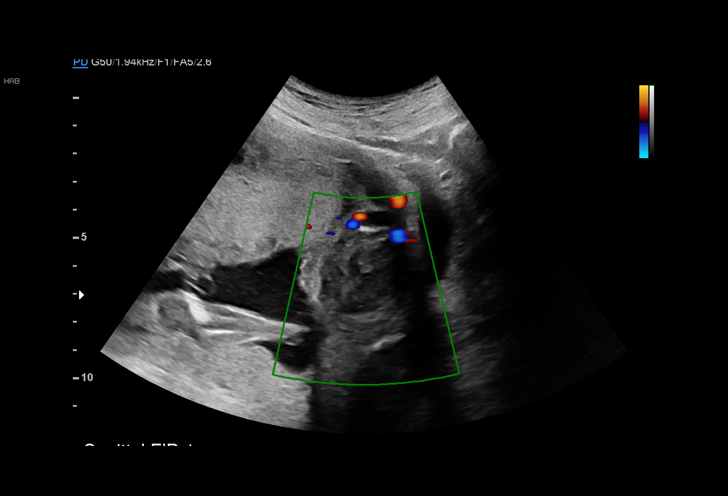
[im 9/19]
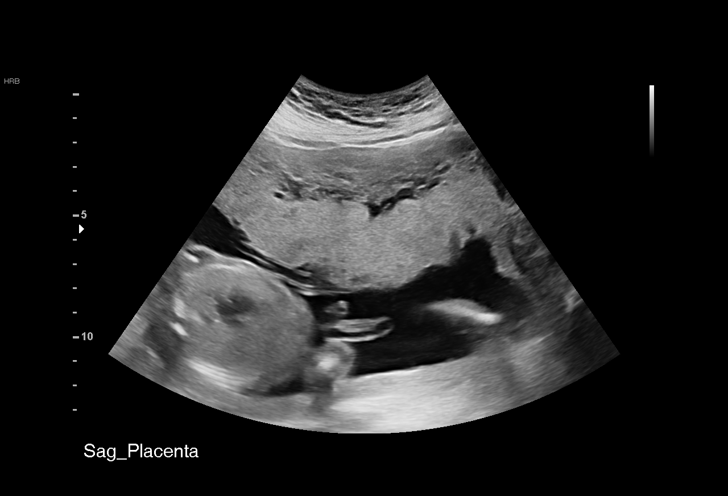
[im 10/19]
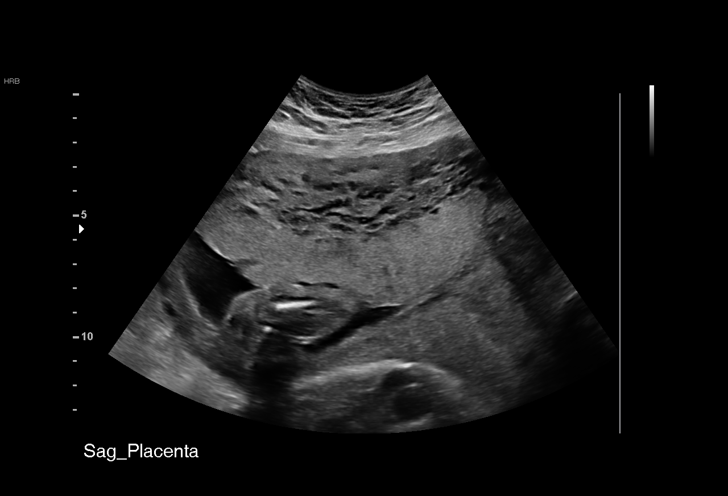
[im 11/19]
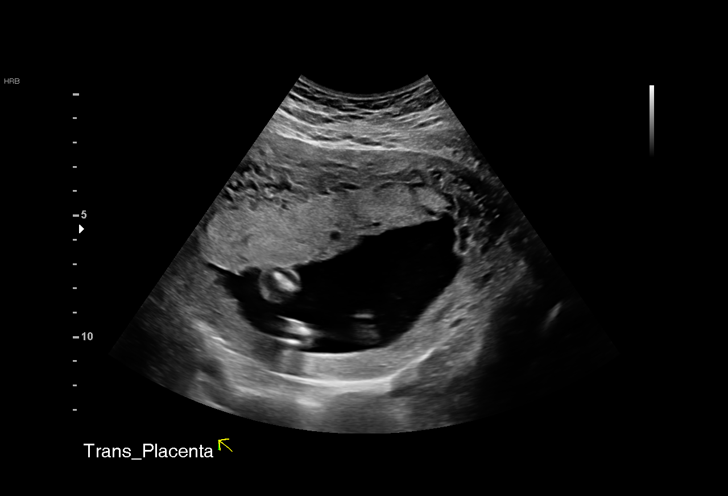
[im 13/19]
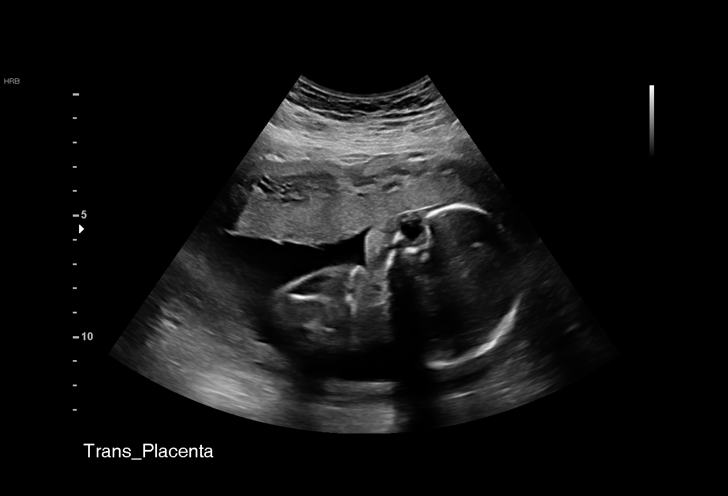
[im 14/19]
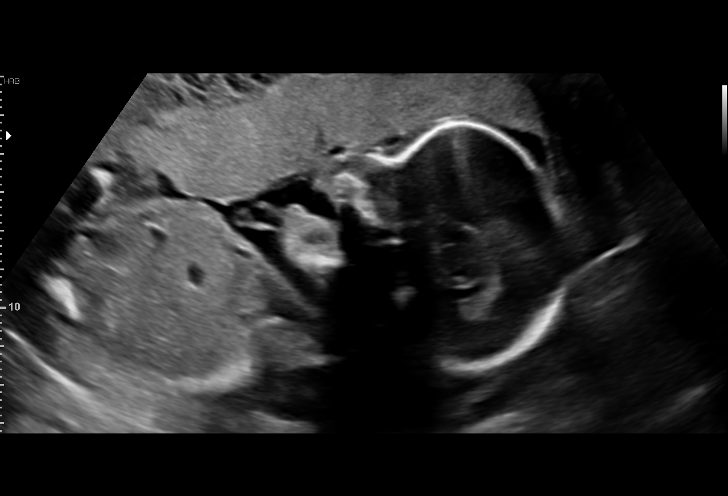
[im 15/19]
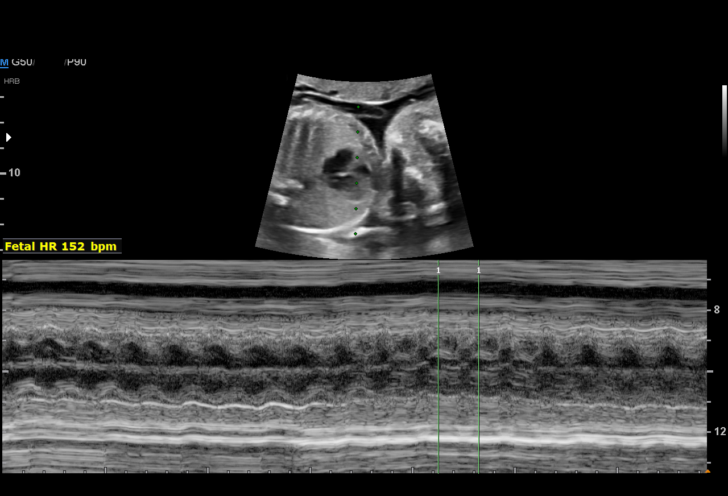
[im 16/19]
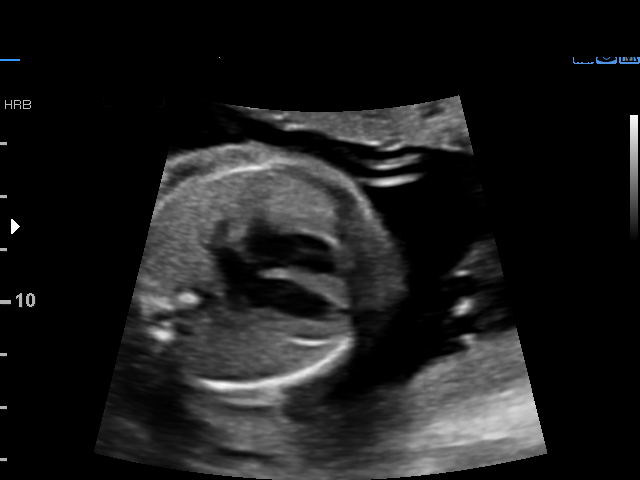
[im 18/19]
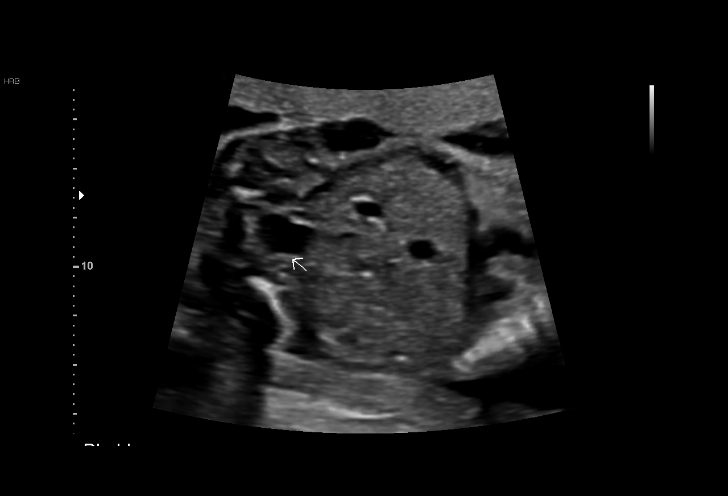
[im 19/19]
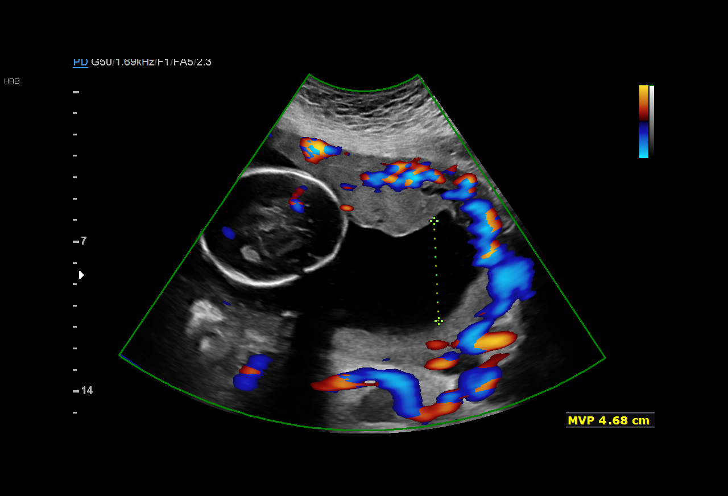

[15 of 19 positions shown; findings below may reference images not displayed]

Name:       DREY JIM             Visit Date: 07/10/2019 [DATE]


  1  [HOSPITAL]                        76815.0      [facility]
 ----------------------------------------------------------------------

 ----------------------------------------------------------------------
Indications

  Vaginal bleeding in pregnancy, second
  trimester
  Uterine fibroids affecting pregnancy in        O34.12,
  second trimester, antepartum
  21 weeks gestation of pregnancy
  History of sickle cell trait
  Obesity complicating pregnancy (bmi 32.6)
 ----------------------------------------------------------------------
Fetal Evaluation

 Num Of Fetuses:          1
 Fetal Heart Rate(bpm):   152
 Cardiac Activity:        Observed
 Presentation:            Transverse, head to maternal left
 Placenta:                Anterior

 Amniotic Fluid
 AFI FV:      Within normal limits

                             Largest Pocket(cm)


 Comment:    No placental abruption or previa identified.
OB History

 Gravidity:    4         Term:   2
 TOP:          1
Gestational Age
 LMP:           21w 5d        Date:  02/08/19                 EDD:   11/15/19
 Best:          21w 5d     Det. By:  LMP  (02/08/19)          EDD:   11/15/19
Anatomy

 Stomach:               Appears normal, left   Bladder:                Appears normal
                        sided
Cervix Uterus Adnexa

 Cervix
 Length:           4.17  cm.
 Normal appearance by transabdominal scan.
Myomas

  Site                     L(cm)      W(cm)      D(cm)       Location
  LUS
 ----------------------------------------------------------------------

  Blood Flow                 RI        PI       Comments

 ----------------------------------------------------------------------
Impression

 Patient was evaluated for c/o vaginal bleeding.
 A limited ultrasound study was performed. Amniotic fluid is
 normal and good fetal activity is seen. On transabdominal
 scan, the cervix looks long and closed. Placenta appears
 normal.
 Lower uterine myoma is seen again (unchanged from
 previous scan).
                 Ikhlaq, Arihara

## 2020-09-08 IMAGING — US US MFM OB FOLLOW-UP
1 series · 14 of 28 positions shown · non-contrast
Comparison: none

[Series 1: us mfm ob follow-up · 74 acquisitions, 14 frames shown]
[im 3/74]
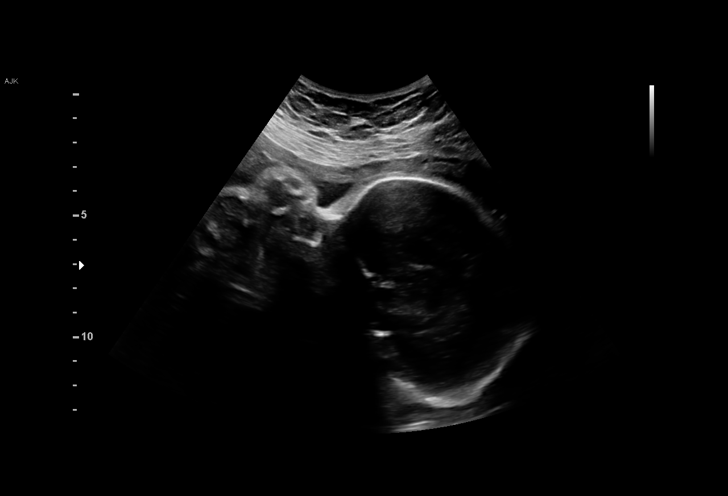
[im 9/74]
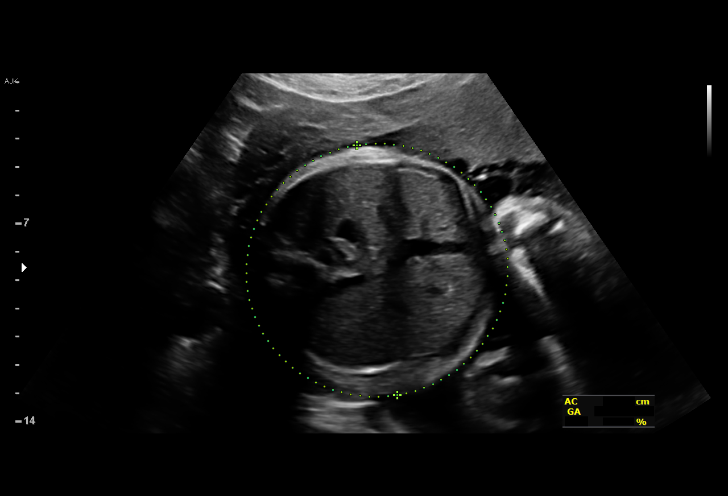
[im 14/74]
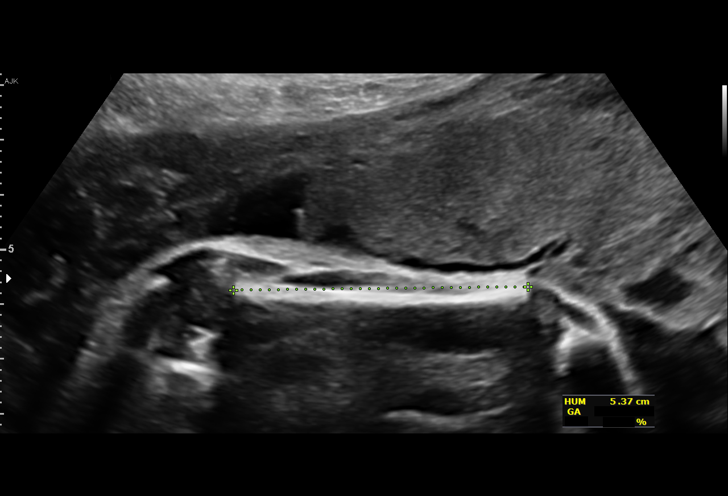
[im 19/74]
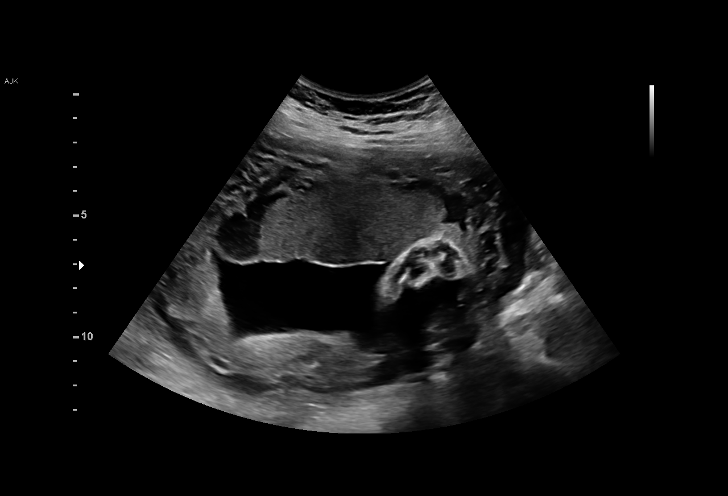
[im 25/74]
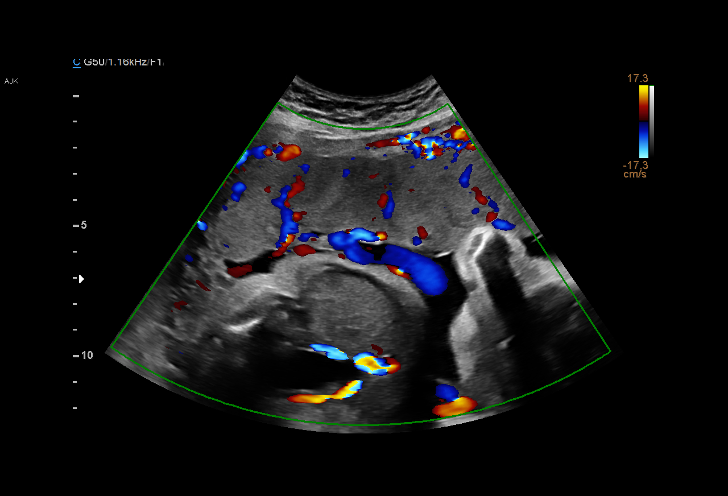
[im 30/74]
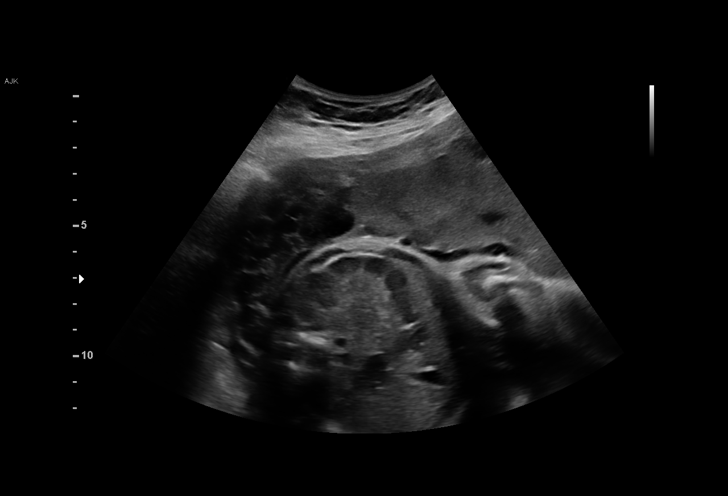
[im 36/74]
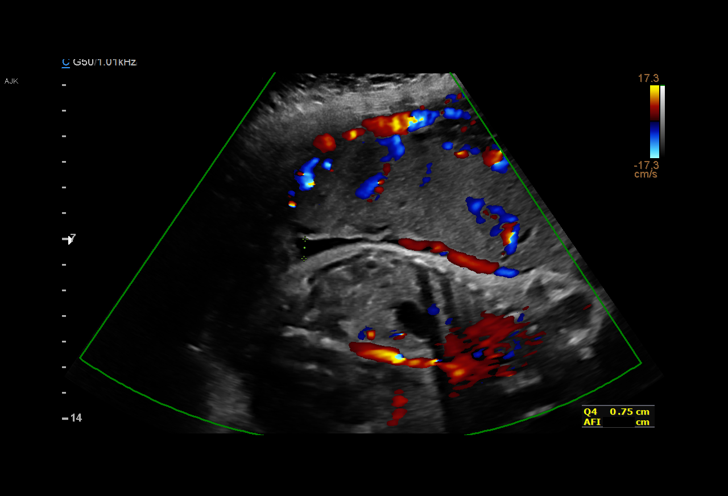
[im 41/74]
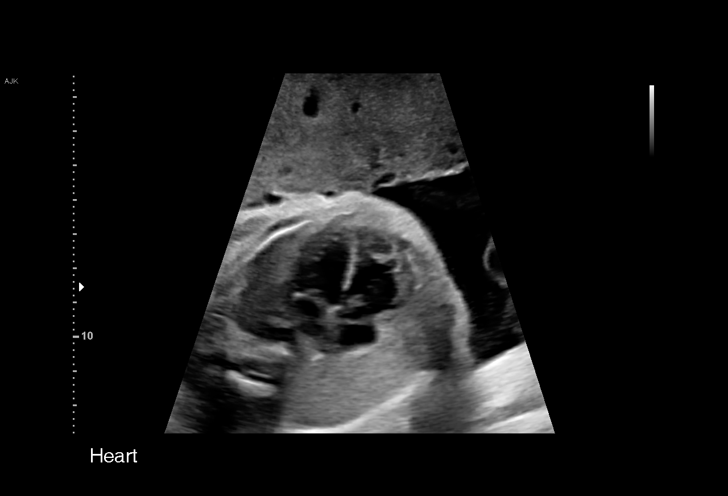
[im 46/74]
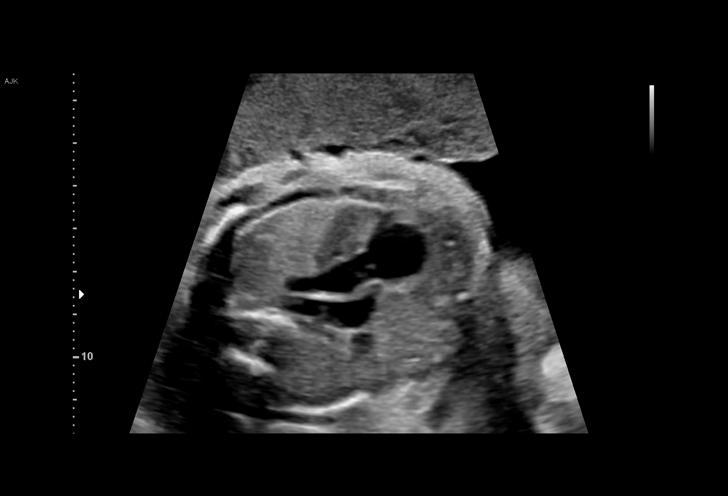
[im 52/74]
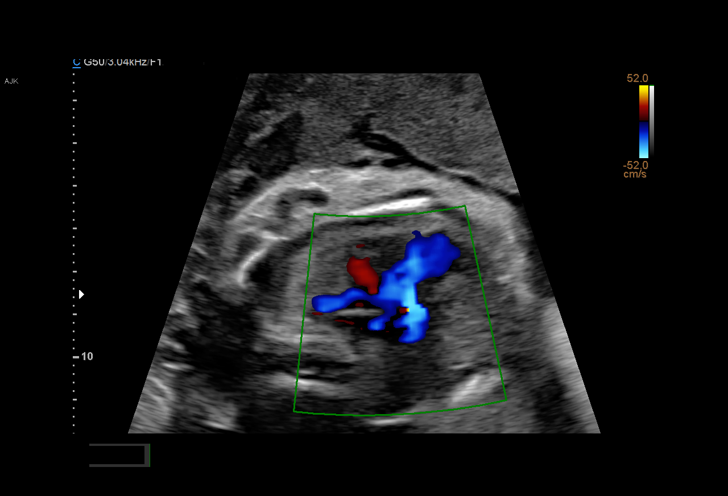
[im 57/74]
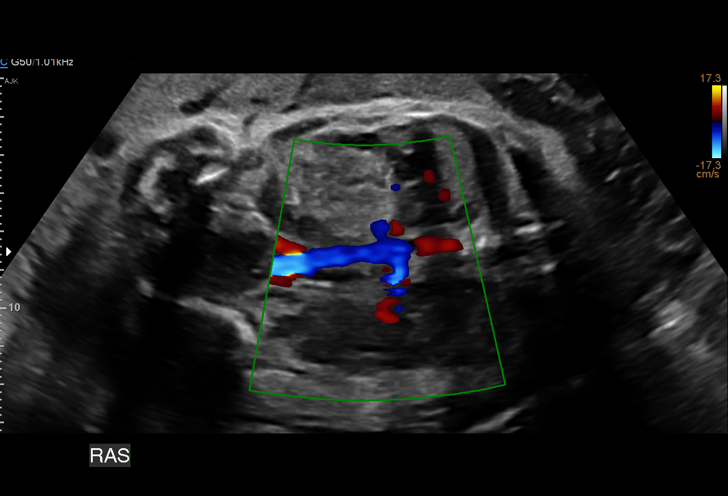
[im 63/74]
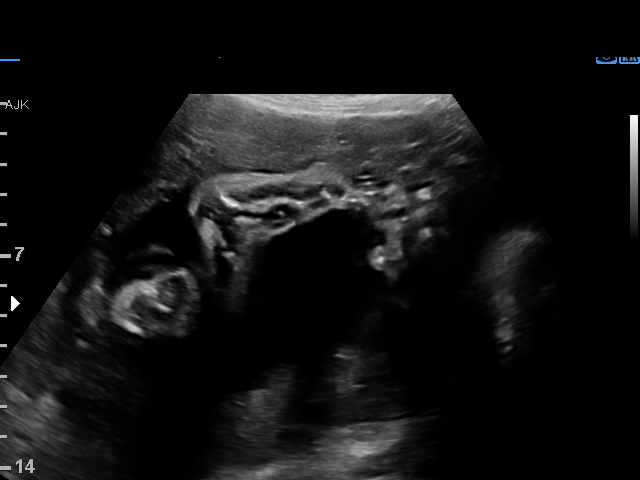
[im 68/74]
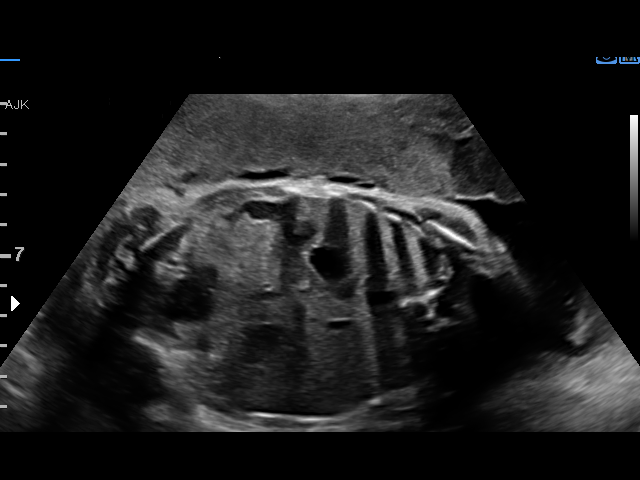
[im 74/74]
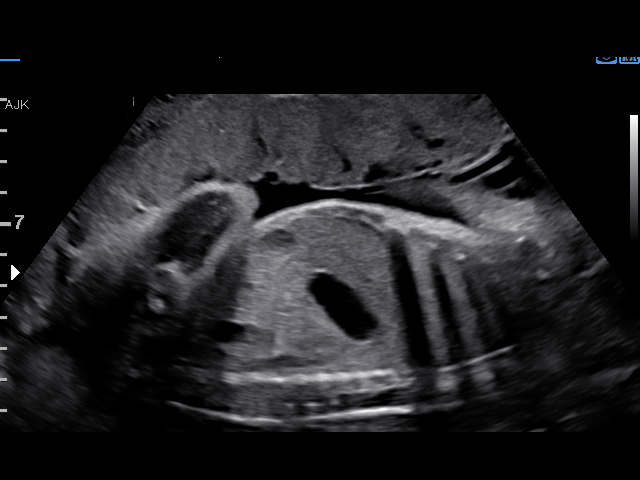

[14 of 28 positions shown; findings below may reference images not displayed]

Indications

 30 weeks gestation of pregnancy
 History of sickle cell trait
 Obesity complicating pregnancy (bmi 32.6)
 Uterine fibroids affecting pregnancy in third   O34.13,
 trimester, antepartum
 Genetic carrier (Silent alpha thalassemia)
 Encounter for other antenatal screening
 follow-up
Fetal Evaluation

 Num Of Fetuses:          1
 Fetal Heart Rate(bpm):   121
 Cardiac Activity:        Arrhythmia noted
 Presentation:            Cephalic
 Placenta:                Anterior

 Amniotic Fluid
 AFI FV:      Within normal limits

 AFI Sum(cm)     %Tile       Largest Pocket(cm)
 13.1            39

 RUQ(cm)       RLQ(cm)        LUQ(cm)        LLQ(cm)
 4.2           0.8            4
Biometry
 BPD:      74.1   mm     G. Age:  29w 5d         25  %    CI:         70.18  %    70 - 86
                                                          FL/HC:       21.4  %    19.2 -
 HC:      282.1   mm     G. Age:  30w 6d         36  %    HC/AC:       0.99       0.99 -
 AC:      285.2   mm     G. Age:  32w 4d         96  %    FL/BPD:      81.5  %    71 - 87
 FL:       60.4   mm     G. Age:  31w 3d         71  %    FL/AC:       21.2  %    20 - 24
 HUM:      53.5   mm     G. Age:  31w 1d         71  %

 Est. FW:    2171   gm      4 lb 1 oz     89  %
OB History

 Gravidity:     4         Term:  2
 TOP:           1
Gestational Age

 LMP:            30w 1d       Date:  02/08/19                   EDD:  11/15/19
 U/S Today:      31w 1d                                         EDD:  11/08/19
 Best:           30w 1d    Det. By:  LMP  (02/08/19)            EDD:  11/15/19
Anatomy

 Cranium:                Appears normal         Aortic Arch:            Previously seen
 Cavum:                  Appears normal         Ductal Arch:            Previously seen
 Ventricles:             Appears normal         Diaphragm:              Appears normal
 Choroid Plexus:         Previously seen        Stomach:                Appears normal, left
                                                                        sided
 Cerebellum:             Previously seen        Abdomen:                Appears normal
 Posterior Fossa:        Previously seen        Abdominal Wall:         Previously seen
 Nuchal Fold:            Previously seen        Cord Vessels:           Previously seen
 Face:                   Orbits and profile     Kidneys:                Appear normal
                         previously seen
 Lips:                   Appears normal         Bladder:                Appears normal
 Thoracic:               Appears normal         Spine:                  Previously seen
 Heart:                  Appears normal         Upper Extremities:      Previously seen
                         (4CH, axis, and situs)
 RVOT:                   Appears normal         Lower Extremities:      Previously seen
 LVOT:                   Appears normal

 Other:   Nasal bone visualized. Fetus appears to be female. Heels and 5th digit
          prev. visualized.
Cervix Uterus Adnexa

 Cervix
 Not visualized (advanced GA >18wks)

 Uterus
 Single fibroid noted, see table below.

 Right Ovary
 Not visualized.

 Left Ovary
 Not visualized.
 Adnexa
 No abnormality visualized.
Myomas

 Site                      L(cm)      W(cm)      D(cm)       Location
 LUS

 Blood Flow                  RI       PI        Comments

Impression

 Follow up growth
 Normal interval growth
 Intermittent premature atrial contractions were noted today
 irregular and occasional. I reviewed with Ms. Bernardien Patat
 images. We discussed the cause of PAC's to include caffine,
 stimulants and smoking. She notes that she has been drinking
 more soda lately. I discussed that PAC's are very common and
 often resolve on there own, with a small exception of those that
 convert to sustained fetal supraventricular tachycardia.
 Therefore we recommend weekly fetal ascultation until
 resolved.
Recommendations

 Given Ms. Padam BMI we have scheduled her to return in 4
 weeks.

## 2020-10-13 ENCOUNTER — Encounter: Payer: Self-pay | Admitting: Family Medicine

## 2020-10-13 ENCOUNTER — Other Ambulatory Visit: Payer: Self-pay

## 2020-10-13 ENCOUNTER — Ambulatory Visit (INDEPENDENT_AMBULATORY_CARE_PROVIDER_SITE_OTHER): Payer: Medicaid Other | Admitting: Family Medicine

## 2020-10-13 VITALS — BP 122/73 | HR 78 | Wt 250.0 lb

## 2020-10-13 DIAGNOSIS — N8110 Cystocele, unspecified: Secondary | ICD-10-CM | POA: Diagnosis not present

## 2020-10-13 NOTE — Progress Notes (Signed)
   Subjective:    Patient ID: Sandra Farrell, female    DOB: 1985-07-16, 35 y.o.   MRN: [id number]  HPI Patient seen for increased pelvic pressure with incomplete bladder emptying. Started on Friday. Took a camera and thought she saw her cervix. No palliating or provoking factors. Denies abnormal bleeding, stress or overflow incontinence.    Review of Systems     Objective:   Physical Exam Vitals reviewed. Exam conducted with a chaperone present.  Constitutional:      Appearance: Normal appearance.  Abdominal:     Hernia: There is no hernia in the left inguinal area or right inguinal area.  Genitourinary:    Labia:        Right: No rash, tenderness or lesion.        Left: No rash, tenderness or lesion.      Urethra: No prolapse.     Comments: Slightly pronounced urethra meatus, grade 1 cystocele. No uterine prolapse noted. Lymphadenopathy:     Lower Body: No right inguinal adenopathy. No left inguinal adenopathy.  Neurological:     Mental Status: She is alert.  Psychiatric:        Mood and Affect: Mood normal.        Behavior: Behavior normal.        Thought Content: Thought content normal.      Assessment & Plan:  1. Baden-Walker grade 1 cystocele Cystocele likely from h/o vaginal deliveries. Will refer to PT. F/u in 3 months.

## 2020-11-21 ENCOUNTER — Encounter: Payer: Self-pay | Admitting: Physical Therapy

## 2020-11-21 ENCOUNTER — Ambulatory Visit: Payer: Medicaid Other | Attending: Family Medicine | Admitting: Physical Therapy

## 2020-11-21 ENCOUNTER — Other Ambulatory Visit: Payer: Self-pay

## 2020-11-21 DIAGNOSIS — M6281 Muscle weakness (generalized): Secondary | ICD-10-CM | POA: Diagnosis not present

## 2020-11-21 DIAGNOSIS — R293 Abnormal posture: Secondary | ICD-10-CM | POA: Diagnosis not present

## 2020-11-21 NOTE — Therapy (Addendum)
[facility] [address] [place], [place], [address] Phone: [phone]   Fax:  [phone]  Physical Therapy Evaluation  Patient Details  Name: Sandra Farrell MRN: [id number] Date of Birth: [birth year] Referring Provider (PT): Truett Mainland, DO  Encounter Date: 11/21/2020   PT End of Session - 11/21/20 1033     Visit Number 1    Date for PT Re-Evaluation 02/13/21    Authorization Type medicaid healthy blue    PT Start Time [phone]   late   PT Stop Time 1016    PT Time Calculation (min) 33 min    Activity Tolerance Patient tolerated treatment well    Behavior During Therapy [facility] for tasks assessed/performed             Past Medical History:  Diagnosis Date   Fibroid    Vaginal Pap smear, abnormal     Past Surgical History:  Procedure Laterality Date   foot surgery right     INDUCED ABORTION     TUBAL LIGATION Bilateral 11/06/2019   Procedure: POST PARTUM TUBAL LIGATION;  Surgeon: Donnamae Jude, MD;  Location: [facility] LD [facility]S;  Service: Gynecology;  Laterality: Bilateral;    There were no vitals filed for this visit.    Subjective Assessment - 11/21/20 0946     Subjective Pt has bulge all the time and right when goes to the bathroom it falls and it out all the time.    Diagnostic tests be able to hold in prolapse    Currently in Pain? Yes    Pain Score 6     Pain Location Vagina    Pain Orientation Mid    Pain Descriptors / Indicators Heaviness    Pain Type Chronic pain    Pain Onset More than a month ago    Pain Frequency Intermittent    Aggravating Factors  full bladder causes back pain    Pain Relieving Factors push it back in helps for 5 min    Multiple Pain Sites No                [facility] PT Assessment - 11/21/20 0001       Assessment   Medical Diagnosis N81.10 (ICD-10-CM) - Baden-Walker grade 1 cystocele    Referring Provider (PT) Truett Mainland, DO    Prior Therapy No       Precautions   Precautions None      Balance Screen   Has the patient fallen in the past 6 months No      [place] residence    Living Arrangements --   3 kids     Prior Function   Level of Independence Independent    Vocation Full time employment    Vocation Requirements sitting      Cognition   Overall Cognitive Status Within Functional Limits for tasks assessed      Functional Tests   Functional tests Single leg stance      Single Leg Stance   Comments Rt less stability      Posture/Postural Control   Posture/Postural Control Postural limitations    Postural Limitations Anterior pelvic tilt;Increased lumbar lordosis   knees hyperextended; arch collapse bil     ROM / Strength   AROM / PROM / Strength AROM;PROM;Strength      AROM   Overall AROM Comments lumbar flexion 50%      PROM   Overall  PROM Comments Rt hip IR 50%; Lt hip rotation +pain      Strength   Overall Strength Comments 4+/5 bil hips      Flexibility   Soft Tissue Assessment /Muscle Length yes    Hamstrings 60% bil      Palpation   Palpation comment tight h/s, glutes, lumbar erectors      Ambulation/Gait   Gait Pattern Lateral hip instability                        Objective measurements completed on examination: See above findings.     Pelvic Floor Special Questions - 11/21/20 0001     Prior Pelvic/Prostate Exam Yes    Prior Pregnancies Yes    Number of Pregnancies 3    Number of Vaginal Deliveries 3    Urinary Leakage Yes    How often little throught the day    Activities that cause leaking Coughing;Sneezing    Urinary urgency Yes    Urinary frequency frequent and can only hold a medium amount    Fecal incontinence --   straining and wiping a lot   Falling out feeling (prolapse) Yes    Skin Integrity Intact    Prolapse Anterior Wall;Posterior Wall;Uterine    Pelvic Floor Internal Exam pt identity confirmed and consent to perform  internal soft tissue assessment was given    Exam Type Vaginal    Palpation firm urethra, TP TTP, Rt coccygeus TTP    Strength fair squeeze, definite lift    Strength # of reps 2   7 quick flicks   Strength # of seconds 6    Tone normal                      PT Education - 11/21/20 1221     Education Details toileting    Person(s) Educated Patient    Methods Handout;Explanation    Comprehension Verbalized understanding              PT Short Term Goals - 11/21/20 1051       PT SHORT TERM GOAL #1   Title initial HEP    Time 4    Period Weeks    Status New    Target Date 12/19/20      PT SHORT TERM GOAL #2   Title urge techniques    Time 4    Period Weeks    Status New    Target Date 12/19/20               PT Long Term Goals - 11/21/20 1044       PT LONG TERM GOAL #1   Title Pt will be ind with advanced HEP    Time 12    Period Weeks    Status New    Target Date 02/13/21      PT LONG TERM GOAL #2   Title Pt will report a reduction of prolapse dicomfort down to 3/10 during a typical day    Time 12    Period Weeks    Status New    Target Date 02/13/21      PT LONG TERM GOAL #3   Title Pt will be able to have  BM without straining due to improved core strength and posture    Time 12    Period Weeks    Status New    Target Date 02/13/21  PT LONG TERM GOAL #4   Title Pt will be able to lift her daughter with correct posture to avoid risk of worsening prolapse    Time 12    Period Weeks    Status New    Target Date 02/13/21                    Plan - 11/21/20 1034     Clinical Impression Statement Pt presents to skilled PT with widespread prolapse of anterior and posterior wall as well as uterine.  Pt has leakage and unable to tolerate full bladder due to increased pain. Pt is straining with BMs and unable to empty fully as she has to continue to wipe and a little comes out.  Pt has tenderness with palpation of  transverse peroneus bil and coccygeus on the Rt side.  Pt has 3/5 MMT holding for 6 seconds for 2 reps.  Pt is able to do 7 quick flicks in 10 seconds.  Pt has a lot of tension throughout posterior kinetic chain as mentioned above and has significant posture abnormalities.  She has a hernia palpated above the umbilicus and diastasis of rectus abdominus.  Pt will benefit greatly from skilled PT to address impairment which are all associated with her pelvic floor dysfunction.    Examination-Activity Limitations Toileting;Continence;Lift    Examination-Participation Restrictions Community Activity    Stability/Clinical Decision Making Evolving/Moderate complexity    Clinical Decision Making Moderate    Rehab Potential Excellent    PT Frequency 1x / week    PT Duration 12 weeks    PT Treatment/Interventions ADLs/Self Care Home Management;Biofeedback;Cryotherapy;Electrical Stimulation;Moist Heat;Therapeutic activities;Therapeutic exercise;Neuromuscular re-education;Patient/family education;Manual techniques;Taping;Dry needling;Passive range of motion    PT Next Visit Plan posture sitting and standing, arch lift, kegels in standing, core control    PT Home Exercise Plan toileting techniques and pressure or pelvic release wand given today    Consulted and Agree with Plan of Care Patient             Patient will benefit from skilled therapeutic intervention in order to improve the following deficits and impairments:  Pain, Postural dysfunction, Impaired flexibility, Increased fascial restricitons, Increased muscle spasms, Hypomobility, Decreased strength, Decreased endurance  Visit Diagnosis: Muscle weakness (generalized)  Abnormal posture     Problem List Patient Active Problem List   Diagnosis Date Noted   H/O bilateral salpingectomy 11/06/2019   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 [address], [place], [place], [address] Phone: [phone]   Fax:  [phone]  Name: Sandra Farrell MRN: [id number] Date of Birth: [birth year]  PHYSICAL THERAPY DISCHARGE SUMMARY  Visits from Start of Care: 1  Current functional level related to goals / functional outcomes: See above, eval only   Remaining deficits: See above   Education / Equipment: See chart regarding evaluation education  Patient agrees to discharge. Patient goals were not met. Patient is being discharged due to not returning since the last visit.  No show 2x and will need a new order to reschedule  Gustavus Bryant, PT 12/16/20 11:03 AM

## 2020-11-21 NOTE — Addendum Note (Signed)
Addended by: Su Hoff on: 11/21/2020 12:26 PM   Modules accepted: Orders

## 2020-11-21 NOTE — Patient Instructions (Signed)

## 2020-11-28 ENCOUNTER — Ambulatory Visit: Payer: Medicaid Other | Attending: Physical Therapy | Admitting: Physical Therapy

## 2020-11-28 ENCOUNTER — Telehealth: Payer: Self-pay | Admitting: Physical Therapy

## 2020-11-28 NOTE — Telephone Encounter (Signed)
Attempt to call due to no show.  Unable to contact with number in file.  Gustavus Bryant, PT 11/28/20 11:33 AM

## 2020-12-13 ENCOUNTER — Ambulatory Visit: Payer: Medicaid Other | Admitting: Physical Therapy

## 2020-12-13 ENCOUNTER — Telehealth: Payer: Self-pay | Admitting: Physical Therapy

## 2020-12-13 NOTE — Telephone Encounter (Signed)
Patient did not show for appointment.  Patient was called and PT left message to please call us back.  This was her second no show.  She was reminded of no show policy and to call back today if she wants to talk about further appointments  Gustavus Bryant, PT 12/13/20 12:08 PM

## 2020-12-20 ENCOUNTER — Encounter: Payer: Self-pay | Admitting: Physical Therapy

## 2020-12-26 ENCOUNTER — Encounter: Payer: Self-pay | Admitting: Physical Therapy

## 2021-01-03 ENCOUNTER — Encounter: Payer: Self-pay | Admitting: Physical Therapy

## 2021-01-05 DIAGNOSIS — Z130 Encounter for screening for diseases of the blood and blood-forming organs and certain disorders involving the immune mechanism: Secondary | ICD-10-CM | POA: Diagnosis not present

## 2021-01-05 DIAGNOSIS — Z8249 Family history of ischemic heart disease and other diseases of the circulatory system: Secondary | ICD-10-CM | POA: Diagnosis not present

## 2021-01-05 DIAGNOSIS — Z1329 Encounter for screening for other suspected endocrine disorder: Secondary | ICD-10-CM | POA: Diagnosis not present

## 2021-01-05 DIAGNOSIS — Z0000 Encounter for general adult medical examination without abnormal findings: Secondary | ICD-10-CM | POA: Diagnosis not present

## 2021-01-05 DIAGNOSIS — E6609 Other obesity due to excess calories: Secondary | ICD-10-CM | POA: Diagnosis not present

## 2021-01-05 DIAGNOSIS — Z13228 Encounter for screening for other metabolic disorders: Secondary | ICD-10-CM | POA: Diagnosis not present

## 2021-01-05 DIAGNOSIS — Z13220 Encounter for screening for lipoid disorders: Secondary | ICD-10-CM | POA: Diagnosis not present

## 2021-01-05 DIAGNOSIS — Z6836 Body mass index (BMI) 36.0-36.9, adult: Secondary | ICD-10-CM | POA: Diagnosis not present

## 2021-01-10 ENCOUNTER — Encounter: Payer: Self-pay | Admitting: Physical Therapy

## 2021-01-17 ENCOUNTER — Encounter: Payer: Self-pay | Admitting: Physical Therapy

## 2021-01-24 ENCOUNTER — Encounter: Payer: Self-pay | Admitting: Physical Therapy

## 2021-04-28 ENCOUNTER — Other Ambulatory Visit: Payer: Self-pay | Admitting: Family Medicine

## 2021-05-02 MED ORDER — METRONIDAZOLE 500 MG PO TABS: 500.0000 mg | ORAL_TABLET | Freq: Two times a day (BID) | ORAL | 0 refills | Status: DC

## 2021-05-18 ENCOUNTER — Encounter: Payer: Self-pay | Admitting: Family Medicine

## 2021-05-19 ENCOUNTER — Other Ambulatory Visit: Payer: Self-pay

## 2021-05-19 MED ORDER — METRONIDAZOLE 500 MG PO TABS: 500.0000 mg | ORAL_TABLET | Freq: Two times a day (BID) | ORAL | 0 refills | Status: DC

## 2021-08-07 ENCOUNTER — Ambulatory Visit (INDEPENDENT_AMBULATORY_CARE_PROVIDER_SITE_OTHER): Payer: Medicaid Other

## 2021-08-07 ENCOUNTER — Ambulatory Visit: Payer: Medicaid Other

## 2021-08-07 ENCOUNTER — Other Ambulatory Visit (HOSPITAL_COMMUNITY)
Admission: RE | Admit: 2021-08-07 | Discharge: 2021-08-07 | Disposition: A | Discharge status: Home / Self Care | Payer: BC Managed Care – PPO | Source: Ambulatory Visit | Attending: Obstetrics and Gynecology | Admitting: Obstetrics and Gynecology

## 2021-08-07 VITALS — BP 121/81 | HR 72 | Wt 245.0 lb

## 2021-08-07 DIAGNOSIS — Z113 Encounter for screening for infections with a predominantly sexual mode of transmission: Secondary | ICD-10-CM

## 2021-08-07 NOTE — Progress Notes (Signed)
Pt presents for routine STI screening. Pt denies any new partners and vaginal discharge.Self swab was sent to the lab. ?F[place] l Annalysia Willenbring, CMA  ? ? ? ?

## 2021-08-08 LAB — CERVICOVAGINAL ANCILLARY ONLY
Chlamydia: NEGATIVE
Comment: NEGATIVE
Comment: NEGATIVE
Comment: NORMAL
Neisseria Gonorrhea: NEGATIVE
Trichomonas: NEGATIVE

## 2021-08-23 ENCOUNTER — Ambulatory Visit: Payer: Medicaid Other | Admitting: Family Medicine

## 2021-10-04 ENCOUNTER — Ambulatory Visit (INDEPENDENT_AMBULATORY_CARE_PROVIDER_SITE_OTHER): Payer: BC Managed Care – PPO | Admitting: Family Medicine

## 2021-10-04 ENCOUNTER — Other Ambulatory Visit (HOSPITAL_COMMUNITY)
Admission: RE | Admit: 2021-10-04 | Discharge: 2021-10-04 | Disposition: A | Discharge status: Home / Self Care | Payer: BC Managed Care – PPO | Source: Ambulatory Visit | Attending: Family Medicine | Admitting: Family Medicine

## 2021-10-04 VITALS — BP 120/79 | HR 71 | Ht 70.0 in | Wt 250.0 lb

## 2021-10-04 DIAGNOSIS — Z01419 Encounter for gynecological examination (general) (routine) without abnormal findings: Secondary | ICD-10-CM | POA: Insufficient documentation

## 2021-10-04 DIAGNOSIS — N920 Excessive and frequent menstruation with regular cycle: Secondary | ICD-10-CM

## 2021-10-04 DIAGNOSIS — N8110 Cystocele, unspecified: Secondary | ICD-10-CM | POA: Diagnosis not present

## 2021-10-04 MED ORDER — NORETHIN ACE-ETH ESTRAD-FE 1-20 MG-MCG(24) PO TABS: 1.0000 | ORAL_TABLET | Freq: Every day | ORAL | 3 refills | Status: DC

## 2021-10-04 NOTE — Progress Notes (Signed)
GYNECOLOGY ANNUAL PREVENTATIVE CARE ENCOUNTER NOTE  Subjective:   Sandra Farrell is a 36 y.o. [phone] female here for a routine annual gynecologic exam.  Current complaints: HMB. 3 days of heavy bleeding and cramping. Continues to have some pelvic pressure and incomplete bladder emptying.  Denies abnormal vaginal bleeding, discharge, pelvic pain, problems with intercourse or other gynecologic concerns.    Gynecologic History Patient's last menstrual period was 09/19/2021. Patient is sexually active  Contraception: tubal ligation Last Pap: 03/2019. Results were: normal Last mammogram: n/a.   The pregnancy intention screening data noted above was reviewed. Potential methods of contraception were discussed. The patient elected to proceed with No data recorded.   Obstetric History OB History  Gravida Para Term Preterm AB Living  '4 3 3 '$ 0 1 3  SAB IAB Ectopic Multiple Live Births  0 1 0 0 3    # Outcome Date GA Lbr Len/2nd Weight Sex Delivery Anes PTL Lv  4 Term 11/05/19 75w4d00:56 / 00:11 7 lb 7.8 oz (3.396 kg) F Vag-Spont EPI  LIV  3 IAB 2018          2 Term 02/22/06 430w0d8 lb 13 oz (3.997 kg) M    LIV  1 Term 12/14/03 4026w2d lb 6 oz (3.799 kg) M Vag-Spont   LIV    Past Medical History:  Diagnosis Date   Fibroid    Vaginal Pap smear, abnormal     Past Surgical History:  Procedure Laterality Date   foot surgery right     INDUCED ABORTION     TUBAL LIGATION Bilateral 11/06/2019   Procedure: POST PARTUM TUBAL LIGATION;  Surgeon: PraDonnamae JudeD;  Location: [facility] LD [facility];  Service: Gynecology;  Laterality: Bilateral;    Current Outpatient Medications on File Prior to Visit  Medication Sig Dispense Refill   metroNIDAZOLE (FLAGYL) 500 MG tablet Take 1 tablet (500 mg total) by mouth 2 (two) times daily. (Patient not taking: Reported on 08/07/2021) 14 tablet 0   No current facility-administered medications on file prior to visit.    No Known Allergies  Social  History   Socioeconomic History   Marital status: Single    Spouse name: Not on file   Number of children: 2   Years of education: Not on file   Highest education level: High school graduate  Occupational History   Occupation: unempolyed  Tobacco Use   Smoking status: Never   Smokeless tobacco: Never  Vaping Use   Vaping Use: Never used  Substance and Sexual Activity   Alcohol use: Not Currently    Comment: not in pregnancy   Drug use: No   Sexual activity: Yes  Other Topics Concern   Not on file  Social History Narrative   Not on file   Social Determinants of Health   Financial Resource Strain: Not on file  Food Insecurity: Not on file  Transportation Needs: Not on file  Physical Activity: Not on file  Stress: Not on file  Social Connections: Not on file  Intimate Partner Violence: Not on file    Family History  Problem Relation Age of Onset   Hypertension Father    Asthma Son    Cancer Neg Hx    Depression Neg Hx    Stroke Neg Hx     The following portions of the patient's history were reviewed and updated as appropriate: allergies, current medications, past family history, past medical history, past social history,  past surgical history and problem list.  Review of Systems Pertinent items are noted in HPI.   Objective:  BP 120/79   Pulse 71   Ht '5\' 10"'$  (1.778 m)   Wt 250 lb (113.4 kg)   LMP 09/19/2021   Breastfeeding No   BMI 35.87 kg/m  Wt Readings from Last 3 Encounters:  10/04/21 250 lb (113.4 kg)  08/07/21 245 lb (111.1 kg)  10/13/20 250 lb (113.4 kg)     Chaperone present during exam  CONSTITUTIONAL: Well-developed, well-nourished female in no acute distress.  HENT:  Normocephalic, atraumatic, External right and left ear normal. Oropharynx is clear and moist EYES: Conjunctivae and EOM are normal. Pupils are equal, round, and reactive to light. No scleral icterus.  NECK: Normal range of motion, supple, no masses.  Normal thyroid.    CARDIOVASCULAR: Normal heart rate noted, regular rhythm RESPIRATORY: Clear to auscultation bilaterally. Effort and breath sounds normal, no problems with respiration noted. BREASTS: Symmetric in size. No masses, skin changes, nipple drainage, or lymphadenopathy. ABDOMEN: Soft, normal bowel sounds, no distention noted.  No tenderness, rebound or guarding.  PELVIC: Normal appearing external genitalia; normal appearing vaginal mucosa and cervix.  No abnormal discharge noted.  Grade 1 cystocele MUSCULOSKELETAL: Normal range of motion. No tenderness.  No cyanosis, clubbing, or edema.  2+ distal pulses. SKIN: Skin is warm and dry. No rash noted. Not diaphoretic. No erythema. No pallor. NEUROLOGIC: Alert and oriented to person, place, and time. Normal reflexes, muscle tone coordination. No cranial nerve deficit noted. PSYCHIATRIC: Normal mood and affect. Normal behavior. Normal judgment and thought content.  Assessment:  Annual gynecologic examination with pap smear   Plan:  1. Well Woman Exam Will follow up results of pap smear and manage accordingly. STD testing discussed. Patient requested testing - Cytology - PAP( [place])  2. Baden-Walker grade 1 cystocele Referral to pelvic PT  3. Menorrhagia with regular cycle Discussed options - will start with COCs. F/u in 3 months.   Routine preventative health maintenance measures emphasized. Please refer to After Visit Summary for other counseling recommendations.    Loma Boston, [place] for [employer]

## 2021-10-05 LAB — CYTOLOGY - PAP
Adequacy: ABSENT
Chlamydia: NEGATIVE
Comment: NEGATIVE
Comment: NEGATIVE
Comment: NORMAL
Diagnosis: NEGATIVE
High risk HPV: NEGATIVE
Neisseria Gonorrhea: NEGATIVE

## 2021-10-19 ENCOUNTER — Other Ambulatory Visit: Payer: Self-pay

## 2021-10-19 ENCOUNTER — Ambulatory Visit: Payer: BC Managed Care – PPO | Attending: Family Medicine | Admitting: Physical Therapy

## 2021-10-19 DIAGNOSIS — N8110 Cystocele, unspecified: Secondary | ICD-10-CM | POA: Diagnosis not present

## 2021-10-19 DIAGNOSIS — R279 Unspecified lack of coordination: Secondary | ICD-10-CM | POA: Diagnosis not present

## 2021-10-19 DIAGNOSIS — M6281 Muscle weakness (generalized): Secondary | ICD-10-CM | POA: Diagnosis not present

## 2021-10-19 DIAGNOSIS — R293 Abnormal posture: Secondary | ICD-10-CM | POA: Insufficient documentation

## 2021-10-19 MED ORDER — NORETHIN ACE-ETH ESTRAD-FE 1-20 MG-MCG(24) PO TABS: 1.0000 | ORAL_TABLET | Freq: Every day | ORAL | 3 refills | Status: DC

## 2021-10-19 NOTE — Therapy (Signed)
OUTPATIENT PHYSICAL THERAPY FEMALE PELVIC EVALUATION   Patient Name: Sandra Farrell MRN: [id number] DOB:02-27-1986, 36 y.o., female Today's Date: 10/19/2021   PT End of Session - 10/19/21 1217     Visit Number 1    Date for PT Re-Evaluation 01/19/22    Authorization Type BC[employer]    PT Start Time 1146    PT Stop Time 1224    PT Time Calculation (min) 38 min    Activity Tolerance Patient tolerated treatment well    Behavior During Therapy WFL for tasks assessed/performed             Past Medical History:  Diagnosis Date   Fibroid    Vaginal Pap smear, abnormal    Past Surgical History:  Procedure Laterality Date   foot surgery right     INDUCED ABORTION     TUBAL LIGATION Bilateral 11/06/2019   Procedure: POST PARTUM TUBAL LIGATION;  Surgeon: Donnamae Jude, MD;  Location: [facility] LD [facility];  Service: Gynecology;  Laterality: Bilateral;   Patient Active Problem List   Diagnosis Date Noted   H/O bilateral salpingectomy 11/06/2019   Diastasis of rectus abdominis 07/28/2019    PCP: none per chart  REFERRING PROVIDER: Truett Mainland, DO  REFERRING DIAG: N81.10 (ICD-10-CM) - Baden-Walker grade 1 cystocele  THERAPY DIAG:  Muscle weakness (generalized) - Plan: PT plan of care cert/re-cert  Abnormal posture - Plan: PT plan of care cert/re-cert  Unspecified lack of coordination - Plan: PT plan of care cert/re-cert  Rationale for Evaluation and Treatment Rehabilitation  ONSET DATE: 2 years  SUBJECTIVE:                                                                                                                                                                                           SUBJECTIVE STATEMENT: Pt reports previously trying PT for cystocele but unable to return due to new job change but is returning able to do appts now. Pt reports pain in back especially in the morning, feels like prolapse is worse, sitting also makes it worse. Pt also has to sit a little  longer to fully empty bladder and sometimes move around.    Fluid intake: Yes: 6 x20oz bottles of water; tea sometimes     PAIN:  Are you having pain? Yes NPRS scale: 10/10 Pain location:  low back  Pain type: tingling and stabbing  Pain description: stabbing   Aggravating factors: full bladder Relieving factors: urinating in the morning  PRECAUTIONS: None  WEIGHT BEARING RESTRICTIONS No  FALLS:  Has patient fallen in last 6 months? No  LIVING ENVIRONMENT: Lives with: lives  with their family Lives in: House/apartment   OCCUPATION: debt collector  PLOF: Independent  PATIENT GOALS to have less pressure/pain from prolapse  PERTINENT HISTORY:  X3 vaginal births, DRA Sexual abuse: No  BOWEL MOVEMENT Pain with bowel movement: No Type of bowel movement:Type (Bristol Stool Scale) 4, Frequency daily - "every time I urine" , and Strain No Fully empty rectum: No Leakage: No Pads: No Fiber supplement: No  URINATION Pain with urination: No Fully empty bladder: Yes: but needs to move a lot to empty Stream: Strong Urgency: Yes: every time  Frequency: every 2.5-3 hours Leakage:  no Pads: No  INTERCOURSE Pain with intercourse:  not painful  Ability to have vaginal penetration:  Yes:   Climax: not painful Marinoff Scale: 0/3  PREGNANCY Vaginal deliveries 3 Tearing No C-section deliveries 0 Currently pregnant No  PROLAPSE Cystocele per chart and referral grade 1    OBJECTIVE:   DIAGNOSTIC FINDINGS:    COGNITION:  Overall cognitive status: Within functional limits for tasks assessed     SENSATION:  Light touch: Appears intact  Proprioception: Appears intact  MUSCLE LENGTH: Bil hamstrings and adductors limited by 25%                POSTURE: rounded shoulders and posterior pelvic tilt   Functional: Pt demonstrates poor lifting mechanics with forward bending and valsalva to lift from ground level and limited core activation.     LUMBARAROM/PROM  A/PROM A/PROM  eval  Flexion Limited by 25% - did report back pain at center of lumbar spine  Extension WFL  Right lateral flexion Limited by 25%  Left lateral flexion Limited by 25%  Right rotation Limited by 25%  Left rotation Limited by 25%   (Blank rows = not tested)  LOWER EXTREMITY ROM: Bil WFL   LOWER EXTREMITY MMT:  Bil hip grossly 4/5; knees and ankles 5/5   PALPATION:   General  TTP at L3-L5 spinous process, bil PSIS                External Perineal Exam pt deferred until off of cycle                             Internal Pelvic Floor pt deferred until off of cycle  Patient confirms identification and approves PT to assess internal pelvic floor and treatment No a time of eval  PELVIC MMT:   MMT eval  Vaginal   Internal Anal Sphincter   External Anal Sphincter   Puborectalis   Diastasis Recti 2 finger separation above umbilicus for 3 inches, no separation below.   (Blank rows = not tested)        TONE: pt deferred until off of cycle  PROLAPSE: pt deferred until off of cycle  TODAY'S TREATMENT  10/19/21 EVAL Examination completed, findings reviewed, pt educated on POC, HEP, and prolapse relief positions. Pt motivated to participate in PT and agreeable to attempt recommendations.     PATIENT EDUCATION:  Education details: [id number] Person educated: Patient Education method: Explanation, Demonstration, Tactile cues, Verbal cues, and Handouts Education comprehension: verbalized understanding and returned demonstration   HOME EXERCISE PROGRAM: [id number]  ASSESSMENT:  CLINICAL IMPRESSION: Patient is a 36 y.o. female  who was seen today for physical therapy evaluation and treatment for grade 1 cystocele from referral. Pt reports she does have back pain related to prolapse and needs extra time to fully empty bladder as well.  Pt found  to have decreased flexibility at spine, hips and decreased bil hip and core strength. Pt does have DRA  separation above umbilicus, limited ability to activate core especially TA, impaired posture, and breathing mechanics with chest breathing noted. Pt deferred internal assessment this date until she is off her cycle then agreeable. Pt given handouts and educated on prolapse relief positions and HEP for pelvic floor strengthening and core activations. Pt denied questions. Pt would benefit from additional PT to further address deficits.     OBJECTIVE IMPAIRMENTS decreased coordination, decreased endurance, decreased strength, impaired flexibility, improper body mechanics, postural dysfunction, and pain.   ACTIVITY LIMITATIONS carrying, lifting, squatting, continence, and caring for others  PARTICIPATION LIMITATIONS: cleaning, shopping, community activity, and yard work  PERSONAL FACTORS Time since onset of injury/illness/exacerbation and 1 comorbidity: x3 vaginal births  are also affecting patient's functional outcome.   REHAB POTENTIAL: Good  CLINICAL DECISION MAKING: Stable/uncomplicated  EVALUATION COMPLEXITY: Low   GOALS: Goals reviewed with patient? Yes  SHORT TERM GOALS: Target date: 11/16/2021  Pt to be I with HEP.  Baseline: Goal status: INITIAL  2.  Pt to demonstrate at least 3/5 pelvic floor strength for improved pelvic stability and decreased strain at pelvic floor.  Baseline:  Goal status: INITIAL  3.  Pt will report her urinary habits  are complete due to improved breathing and evacuation techniques.  Baseline:  Goal status: INITIAL    LONG TERM GOALS: Target date: 01/19/2022     Pt to be I with advanced HEP.  Baseline:  Goal status: INITIAL  2.  Pt to demonstrate improved coordination of pelvic floor and breathing mechanics lifting 25# without compensatory strategies to decrease strain at pelvic floor and prolapse.  Baseline:  Goal status: INITIAL  3.  Pt to demonstrate at least 4/5 pelvic floor strength for improved pelvic stability and decreased strain at  pelvic floor.  Baseline:  Goal status: INITIAL  4.  Pt to be I with voiding mechanics and lifting mechanics to decrease strain at prolapse.  Baseline:  Goal status: INITIAL  PLAN: PT FREQUENCY: 1x/week  PT DURATION:  8 sessions  PLANNED INTERVENTIONS: Therapeutic exercises, Therapeutic activity, Neuromuscular re-education, Patient/Family education, Joint mobilization, Dry Needling, Spinal mobilization, Cryotherapy, Moist heat, scar mobilization, Taping, Biofeedback, and Manual therapy  PLAN FOR NEXT SESSION: internal assessment with pt consent; pelvic floor and breathing coordination; core activations   Stacy Gardner, PT, DPT 10/20/2310:39 PM

## 2021-10-19 NOTE — Progress Notes (Signed)
Patient forgot to go get prescription when originally prescribed. Patients pharmacy put back prescription. Kathrene Alu RN

## 2021-11-03 ENCOUNTER — Ambulatory Visit: Payer: BC Managed Care – PPO | Admitting: Physical Therapy

## 2021-11-10 ENCOUNTER — Ambulatory Visit: Payer: BC Managed Care – PPO | Attending: Family Medicine | Admitting: Physical Therapy

## 2021-11-10 DIAGNOSIS — R293 Abnormal posture: Secondary | ICD-10-CM | POA: Insufficient documentation

## 2021-11-10 DIAGNOSIS — R279 Unspecified lack of coordination: Secondary | ICD-10-CM | POA: Diagnosis not present

## 2021-11-10 DIAGNOSIS — M6281 Muscle weakness (generalized): Secondary | ICD-10-CM | POA: Diagnosis not present

## 2021-11-10 NOTE — Therapy (Signed)
OUTPATIENT PHYSICAL THERAPY FEMALE PELVIC EVALUATION   Patient Name: Sandra Farrell MRN: [id number] DOB:March 22, 1986, 36 y.o., female Today's Date: 11/10/2021   PT End of Session - 11/10/21 0845     Visit Number 2    Date for PT Re-Evaluation 01/19/22    Authorization Type BCBS    PT Start Time 0845    PT Stop Time 0925    PT Time Calculation (min) 40 min    Activity Tolerance Patient tolerated treatment well    Behavior During Therapy [facility] for tasks assessed/performed             Past Medical History:  Diagnosis Date   Fibroid    Vaginal Pap smear, abnormal    Past Surgical History:  Procedure Laterality Date   foot surgery right     INDUCED ABORTION     TUBAL LIGATION Bilateral 11/06/2019   Procedure: POST PARTUM TUBAL LIGATION;  Surgeon: Donnamae Jude, MD;  Location: [facility] LD ORS;  Service: Gynecology;  Laterality: Bilateral;   Patient Active Problem List   Diagnosis Date Noted   H/O bilateral salpingectomy 11/06/2019   Diastasis of rectus abdominis 07/28/2019    PCP: none per chart  REFERRING PROVIDER: Truett Mainland, DO  REFERRING DIAG: N81.10 (ICD-10-CM) - Baden-Walker grade 1 cystocele  THERAPY DIAG:  Muscle weakness (generalized)  Abnormal posture  Unspecified lack of coordination  Rationale for Evaluation and Treatment Rehabilitation  ONSET DATE: 2 years  SUBJECTIVE:                                                                                                                                                                                           SUBJECTIVE STATEMENT: Pt reports no change in prolapse   Fluid intake: Yes: 6 x20oz bottles of water; tea sometimes     PAIN:  Are you having pain? no    PRECAUTIONS: None    PATIENT GOALS to have less pressure/pain from prolapse  PERTINENT HISTORY:  X3 vaginal births, DRA Sexual abuse: No  BOWEL MOVEMENT Pain with bowel movement: No Type of bowel movement:Type (Bristol Stool  Scale) 4, Frequency daily - "every time I urine" , and Strain No Fully empty rectum: No Leakage: No Pads: No Fiber supplement: No  URINATION Pain with urination: No Fully empty bladder: Yes: but needs to move a lot to empty Stream: Strong Urgency: Yes: every time  Frequency: every 2.5-3 hours Leakage:  no Pads: No  INTERCOURSE Pain with intercourse:  not painful  Ability to have vaginal penetration:  Yes:   Climax: not painful Marinoff Scale: 0/3  PREGNANCY Vaginal deliveries 3 Tearing No  C-section deliveries 0 Currently pregnant No  PROLAPSE Cystocele per chart and referral grade 1    OBJECTIVE:   DIAGNOSTIC FINDINGS:    COGNITION:  Overall cognitive status: Within functional limits for tasks assessed     SENSATION:  Light touch: Appears intact  Proprioception: Appears intact  MUSCLE LENGTH: Bil hamstrings and adductors limited by 25%                POSTURE: rounded shoulders and posterior pelvic tilt   Functional: Pt demonstrates poor lifting mechanics with forward bending and valsalva to lift from ground level and limited core activation.    LUMBARAROM/PROM  A/PROM A/PROM  eval  Flexion Limited by 25% - did report back pain at center of lumbar spine  Extension WFL  Right lateral flexion Limited by 25%  Left lateral flexion Limited by 25%  Right rotation Limited by 25%  Left rotation Limited by 25%   (Blank rows = not tested)  LOWER EXTREMITY ROM: Bil WFL   LOWER EXTREMITY MMT:  Bil hip grossly 4/5; knees and ankles 5/5   PALPATION:   General  TTP at L3-L5 spinous process, bil PSIS                External Perineal Exam pt deferred until off of cycle                             Internal Pelvic Floor pt deferred until off of cycle  Patient confirms identification and approves PT to assess internal pelvic floor and treatment No a time of eval  PELVIC MMT:   MMT eval 11/10/21   Vaginal  4/5; 7s isometrics; 6 reps  Internal Anal  Sphincter    External Anal Sphincter    Puborectalis    Diastasis Recti 2 finger separation above umbilicus for 3 inches, no separation below.    (Blank rows = not tested)        TONE: WFL  PROLAPSE: Possible grade 1-2 anterior wall laxity in hooklying with x3 strong cough  TODAY'S TREATMENT   11/10/21: Pt consented to internal vaginal assessment this date and found to have decreased strength, coordination, and endurance and anterior wall laxity noted. Pt cued to attempt pelvic floor contraction with cough and demonstrated decreased descent of tissue possible grade 1  All exercises cued for coordination of pelvic floor and breathing mechanics with improved core activations  Hooklying ball squeeze with TA activations 2x10 X10 bridges  2x10 alt opp hand/knee ball press for core activations  Bird dogs x10 15# Sit to standr from lowest setting mat table 2x10 X10 mario punched 5#   10/19/21 EVAL Examination completed, findings reviewed, pt educated on POC, HEP, and prolapse relief positions. Pt motivated to participate in PT and agreeable to attempt recommendations.     PATIENT EDUCATION:  Education details: [id number] Person educated: Patient Education method: Explanation, Demonstration, Corporate treasurer cues, Verbal cues, and Handouts Education comprehension: verbalized understanding and returned demonstration   HOME EXERCISE PROGRAM: [id number]  ASSESSMENT:  CLINICAL IMPRESSION: Patient reports no change in symptoms especially when lifting 30# DTR, but relief positions is helpful. Pt session focused on internal vaginal assessment as pt unable to do this at eval and requested to wait, today found to have prolapse, weakness at pelvic floor and benefited from cues for coordination with breathing and pelvic floor mobility to decrease strain at pelvic floor. Pt had no pain. Pt then directed in strengthening exercises within  tolerance and without worsening of prolapse symptoms with moderate cues  needed for coordination of pelvic floor and breathing mechanics to decrease strain. Pt tolerated well. Pt would benefit from additional PT to further address deficits.     OBJECTIVE IMPAIRMENTS decreased coordination, decreased endurance, decreased strength, impaired flexibility, improper body mechanics, postural dysfunction, and pain.   ACTIVITY LIMITATIONS carrying, lifting, squatting, continence, and caring for others  PARTICIPATION LIMITATIONS: cleaning, shopping, community activity, and yard work  PERSONAL FACTORS Time since onset of injury/illness/exacerbation and 1 comorbidity: x3 vaginal births  are also affecting patient's functional outcome.   REHAB POTENTIAL: Good  CLINICAL DECISION MAKING: Stable/uncomplicated  EVALUATION COMPLEXITY: Low   GOALS: Goals reviewed with patient? Yes  SHORT TERM GOALS: Target date: 11/16/2021  Pt to be I with HEP.  Baseline: Goal status: INITIAL  2.  Pt to demonstrate at least 3/5 pelvic floor strength for improved pelvic stability and decreased strain at pelvic floor.  Baseline:  Goal status: INITIAL  3.  Pt will report her urinary habits  are complete due to improved breathing and evacuation techniques.  Baseline:  Goal status: INITIAL    LONG TERM GOALS: Target date: 01/19/2022     Pt to be I with advanced HEP.  Baseline:  Goal status: INITIAL  2.  Pt to demonstrate improved coordination of pelvic floor and breathing mechanics lifting 25# without compensatory strategies to decrease strain at pelvic floor and prolapse.  Baseline:  Goal status: INITIAL  3.  Pt to demonstrate at least 4/5 pelvic floor strength for improved pelvic stability and decreased strain at pelvic floor.  Baseline:  Goal status: INITIAL  4.  Pt to be I with voiding mechanics and lifting mechanics to decrease strain at prolapse.  Baseline:  Goal status: INITIAL  PLAN: PT FREQUENCY: 1x/week  PT DURATION:  8 sessions  PLANNED INTERVENTIONS:  Therapeutic exercises, Therapeutic activity, Neuromuscular re-education, Patient/Family education, Joint mobilization, Dry Needling, Spinal mobilization, Cryotherapy, Moist heat, scar mobilization, Taping, Biofeedback, and Manual therapy  PLAN FOR NEXT SESSION: internal assessment with pt consent; pelvic floor and breathing coordination; core activations   Stacy Gardner, PT, DPT 07/14/239:28 AM

## 2021-11-17 ENCOUNTER — Telehealth: Payer: Self-pay | Admitting: Physical Therapy

## 2021-11-17 ENCOUNTER — Ambulatory Visit: Payer: BC Managed Care – PPO | Admitting: Physical Therapy

## 2021-11-17 NOTE — Telephone Encounter (Signed)
PT called pt about this morning's appointment at 0930. Pt did not answer, voicemail left.    Stacy Gardner, PT, DPT 11/17/2308:49 AM

## 2021-11-24 ENCOUNTER — Telehealth: Payer: Self-pay | Admitting: Physical Therapy

## 2021-11-24 ENCOUNTER — Ambulatory Visit: Payer: BC Managed Care – PPO | Admitting: Physical Therapy

## 2021-11-24 NOTE — Telephone Encounter (Signed)
PT called pt about this morning's appointment at 0930. PT spoke with pt, her grandmother has passed away this morning and pt requesting to reschedule her appointment at a later time.   Stacy Gardner, PT, DPT 07/28/239:55 AM

## 2021-12-01 ENCOUNTER — Ambulatory Visit: Payer: BC Managed Care – PPO | Attending: Family Medicine | Admitting: Physical Therapy

## 2021-12-01 DIAGNOSIS — R293 Abnormal posture: Secondary | ICD-10-CM | POA: Insufficient documentation

## 2021-12-01 DIAGNOSIS — R279 Unspecified lack of coordination: Secondary | ICD-10-CM | POA: Insufficient documentation

## 2021-12-01 DIAGNOSIS — M6281 Muscle weakness (generalized): Secondary | ICD-10-CM | POA: Insufficient documentation

## 2021-12-01 NOTE — Therapy (Addendum)
OUTPATIENT PHYSICAL THERAPY FEMALE PELVIC EVALUATION   Patient Name: Sandra Farrell MRN: [id number] DOB:1985/09/25, 36 y.o., female Today's Date: 12/01/2021   PT End of Session - 12/01/21 0938     Visit Number 3    Date for PT Re-Evaluation 01/19/22    Authorization Type [employer]BS    PT Start Time [phone]   arrival time   PT Stop Time 1014    PT Time Calculation (min) 38 min    Activity Tolerance Patient tolerated treatment well    Behavior During Therapy WFL for tasks assessed/performed             Past Medical History:  Diagnosis Date   Fibroid    Vaginal Pap smear, abnormal    Past Surgical History:  Procedure Laterality Date   foot surgery right     INDUCED ABORTION     TUBAL LIGATION Bilateral 11/06/2019   Procedure: POST PARTUM TUBAL LIGATION;  Surgeon: Donnamae Jude, MD;  Location: [facility] LD ORS;  Service: Gynecology;  Laterality: Bilateral;   Patient Active Problem List   Diagnosis Date Noted   H/O bilateral salpingectomy 11/06/2019   Diastasis of rectus abdominis 07/28/2019    PCP: none per chart  REFERRING PROVIDER: Truett Mainland, DO  REFERRING DIAG: 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    Fluid intake: Yes: 6 x20oz bottles of water; tea sometimes     PAIN:  Are you having pain? no    PRECAUTIONS: None    PATIENT GOALS to have less pressure/pain from  prolapse  PERTINENT HISTORY:  X3 vaginal births, DRA Sexual abuse: No  BOWEL MOVEMENT Pain with bowel movement: No Type of bowel movement:Type (Bristol Stool Scale) 4, Frequency daily - "every time I urine" , and Strain No Fully empty rectum: No Leakage: No Pads: No Fiber supplement: No  URINATION Pain with urination: No Fully empty bladder: Yes: but needs to move a lot to empty Stream: Strong Urgency: Yes: every time  Frequency: every 2.5-3 hours Leakage:  no Pads: No  INTERCOURSE  Pain with intercourse:  not painful  Ability to have vaginal penetration:  Yes:   Climax: not painful Marinoff Scale: 0/3  PREGNANCY Vaginal deliveries 3 Tearing No C-section deliveries 0 Currently pregnant No  PROLAPSE Cystocele per chart and referral grade 1    OBJECTIVE:   DIAGNOSTIC FINDINGS:    COGNITION:  Overall cognitive status: Within functional limits for tasks assessed     SENSATION:  Light touch: Appears intact  Proprioception: Appears intact  MUSCLE LENGTH: Bil hamstrings and adductors limited by 25%                POSTURE: rounded shoulders and posterior pelvic tilt   Functional: Pt demonstrates poor lifting mechanics with forward bending and valsalva to lift from ground level and limited core activation.    LUMBARAROM/PROM  A/PROM A/PROM  eval  Flexion Limited by 25% - did report back pain at center of lumbar spine  Extension WFL  Right lateral flexion Limited by 25%  Left lateral flexion Limited by 25%  Right rotation Limited by 25%  Left rotation Limited by 25%   (Blank rows = not tested)  LOWER EXTREMITY ROM: Bil WFL   LOWER EXTREMITY MMT:  Bil hip grossly 4/5; knees and ankles 5/5   PALPATION:   General  TTP at L3-L5 spinous process, bil PSIS                External Perineal Exam pt deferred until off of cycle                             Internal Pelvic Floor pt deferred until off of cycle  Patient confirms identification and approves  PT to assess internal pelvic floor and treatment No a time of eval  PELVIC MMT:   MMT eval 11/10/21   Vaginal  4/5; 7s isometrics; 6 reps  Internal Anal Sphincter    External Anal Sphincter    Puborectalis    Diastasis Recti 2 finger separation above umbilicus for 3 inches, no separation below.    (Blank rows = not tested)        TONE: WFL  PROLAPSE: Possible grade 1-2 anterior wall laxity in hooklying with x3 strong cough  TODAY'S TREATMENT   12/01/2021: NMRE:All exercises cued for coordination of pelvic floor and breathing mechanics with improved core activations  Hooklying ball squeeze with TA activations x20 X20 bridges with pelvic floor contraction X20 Sit to stand from lowest setting of mat table with pelvic floor contraction and exhale 2x10 alt opp hand/knee ball press for core activations in sitting  11/10/21: Pt consented to internal vaginal assessment this date and found to have decreased strength, coordination, and endurance and anterior wall laxity noted. Pt cued to attempt pelvic floor contraction with cough and demonstrated decreased descent of tissue possible grade 1  All exercises cued for coordination of pelvic floor and breathing mechanics with improved core activations  Hooklying ball squeeze with TA activations 2x10 X10 bridges  2x10 alt opp hand/knee ball press for core activations  Bird dogs x10 15# Sit to stand from lowest setting mat table 2x10 X10 mario punched 5#   PATIENT EDUCATION:  Education details: [id number] Person educated: Patient Education method: Consulting civil engineer, Media planner, Corporate treasurer cues, Verbal cues, and Handouts Education comprehension: verbalized understanding and returned demonstration   HOME EXERCISE PROGRAM: [id number]  ASSESSMENT:  CLINICAL IMPRESSION: Patient reports after last session with PT she noticed improved symptoms of prolapse however she was  unable to make last two appointments with a death in the family and she reports  a return in symptoms similar to prior to PT but no worse.  Pt session focused on strengthening exercises within tolerance and without worsening of prolapse symptoms with moderate cues needed for coordination of pelvic floor and breathing mechanics to decrease strain. Emphasis of treatment on core and breathing coordination with pelvic floor during all exercises which required extra time for good technique and consistent core activation. Pt tolerated well. Pt would benefit from additional PT to further address deficits.     OBJECTIVE IMPAIRMENTS decreased coordination, decreased endurance, decreased strength, impaired flexibility, improper body mechanics, postural dysfunction, and pain.   ACTIVITY LIMITATIONS carrying, lifting, squatting, continence, and caring for others  PARTICIPATION LIMITATIONS: cleaning, shopping, community activity, and yard work  PERSONAL FACTORS Time since onset of injury/illness/exacerbation and 1 comorbidity: x3 vaginal births  are also affecting patient's functional outcome.   REHAB POTENTIAL: Good  CLINICAL DECISION MAKING: Stable/uncomplicated  EVALUATION COMPLEXITY: Low   GOALS: Goals reviewed with patient? Yes  SHORT TERM GOALS: Target date: 11/16/2021  Pt to be I with HEP.  Baseline: Goal status: INITIAL  2.  Pt to demonstrate at least 3/5 pelvic floor strength for improved pelvic stability and decreased strain at pelvic floor.  Baseline:  Goal status: INITIAL  3.  Pt will report her urinary habits  are complete due to improved breathing and evacuation techniques.  Baseline:  Goal status: INITIAL    LONG TERM GOALS: Target date: 01/19/2022     Pt to be I with advanced HEP.  Baseline:  Goal status: INITIAL  2.  Pt to demonstrate improved coordination of pelvic floor and breathing mechanics lifting 25# without compensatory strategies to decrease strain at pelvic floor and prolapse.  Baseline:  Goal status: INITIAL  3.  Pt to demonstrate at  least 4/5 pelvic floor strength for improved pelvic stability and decreased strain at pelvic floor.  Baseline:  Goal status: INITIAL  4.  Pt to be I with voiding mechanics and lifting mechanics to decrease strain at prolapse.  Baseline:  Goal status: INITIAL  PLAN: PT FREQUENCY: 1x/week  PT DURATION:  8 sessions  PLANNED INTERVENTIONS: Therapeutic exercises, Therapeutic activity, Neuromuscular re-education, Patient/Family education, Joint mobilization, Dry Needling, Spinal mobilization, Cryotherapy, Moist heat, scar mobilization, Taping, Biofeedback, and Manual therapy  PLAN FOR NEXT SESSION: internal assessment with pt consent; pelvic floor and breathing coordination; core activations   Stacy Gardner, PT, DPT 12/01/2308:56 AM   PHYSICAL THERAPY DISCHARGE SUMMARY  Visits from Start of Care: 3  Current functional level related to goals / functional outcomes: Unable to formally assess, pt called after last session (12/01/21) requesting DC due to financial reasons.    Remaining deficits: Unable to formally reassess, however at last session pt continued to report prolapse symptoms present, was improving with HEP at home.    Education / Equipment: HEP   Patient agrees to discharge. Patient goals were partially met. Patient is being discharged due to financial reasons. Thank you for the referral.    Stacy Gardner, PT, DPT 08/23/239:37 AM

## 2021-12-08 ENCOUNTER — Ambulatory Visit: Payer: BC Managed Care – PPO | Admitting: Physical Therapy

## 2021-12-14 ENCOUNTER — Ambulatory Visit: Payer: BC Managed Care – PPO | Admitting: Physical Therapy

## 2021-12-15 ENCOUNTER — Ambulatory Visit: Payer: BC Managed Care – PPO | Admitting: Physical Therapy

## 2021-12-22 ENCOUNTER — Ambulatory Visit: Payer: BC Managed Care – PPO | Admitting: Physical Therapy

## 2021-12-29 ENCOUNTER — Ambulatory Visit: Payer: BC Managed Care – PPO | Admitting: Family Medicine

## 2022-01-02 ENCOUNTER — Ambulatory Visit: Payer: BC Managed Care – PPO | Admitting: Physical Therapy

## 2022-01-05 ENCOUNTER — Encounter: Payer: BC Managed Care – PPO | Admitting: Physical Therapy

## 2022-01-12 ENCOUNTER — Encounter: Payer: BC Managed Care – PPO | Admitting: Physical Therapy

## 2022-01-19 ENCOUNTER — Encounter: Payer: BC Managed Care – PPO | Admitting: Physical Therapy

## 2022-01-22 DIAGNOSIS — J029 Acute pharyngitis, unspecified: Secondary | ICD-10-CM | POA: Diagnosis not present

## 2023-05-22 DIAGNOSIS — Z0000 Encounter for general adult medical examination without abnormal findings: Secondary | ICD-10-CM | POA: Diagnosis not present

## 2023-05-22 DIAGNOSIS — Z23 Encounter for immunization: Secondary | ICD-10-CM | POA: Diagnosis not present

## 2023-05-22 DIAGNOSIS — D649 Anemia, unspecified: Secondary | ICD-10-CM | POA: Diagnosis not present

## 2023-06-13 ENCOUNTER — Ambulatory Visit: Payer: BC Managed Care – PPO | Admitting: Family Medicine

## 2023-06-13 VITALS — BP 125/84 | HR 81 | Wt 259.0 lb

## 2023-06-13 DIAGNOSIS — N814 Uterovaginal prolapse, unspecified: Secondary | ICD-10-CM | POA: Diagnosis not present

## 2023-06-13 DIAGNOSIS — Z01419 Encounter for gynecological examination (general) (routine) without abnormal findings: Secondary | ICD-10-CM

## 2023-06-13 NOTE — Progress Notes (Signed)
ANNUAL EXAM Patient name: [name] MRN [id number]  Date of birth: [birth year] Chief Complaint:   Annual Exam  History of Present Illness:   Eyva Califano Farrell is a 38 y.o.  [phone]  female  being seen today for a routine annual exam.  Current complaints: continues to have prolapse. Worse if she has a full bladder. Has noticed her cervix when she urinates and has had to replace it. Has tried PT, but not helpful.  Patient's last menstrual period was 06/01/2023 (exact date).    Last pap 09/2021. Results were:  normal . H/O abnormal pap: no Last mammogram: n/a     06/13/2023   11:24 AM  Depression screen PHQ 2/9  Decreased Interest 0  Down, Depressed, Hopeless 0  PHQ - 2 Score 0  Altered sleeping 0  Tired, decreased energy 2  Change in appetite 0  Feeling bad or failure about yourself  0  Trouble concentrating 0  Moving slowly or fidgety/restless 0  Suicidal thoughts 0  PHQ-9 Score 2        06/13/2023   11:24 AM  GAD 7 : Generalized Anxiety Score  Nervous, Anxious, on Edge 0  Control/stop worrying 0  Worry too much - different things 0  Trouble relaxing 0  Restless 0  Easily annoyed or irritable 0  Afraid - awful might happen 0  Total GAD 7 Score 0     Review of Systems:   Pertinent items are noted in HPI Denies any headaches, blurred vision, fatigue, shortness of breath, chest pain, abdominal pain, abnormal vaginal discharge/itching/odor/irritation, problems with periods, bowel movements, urination, or intercourse unless otherwise stated above. Pertinent History Reviewed:  Reviewed past medical,surgical, social and family history.  Reviewed problem list, medications and allergies. Physical Assessment:   Vitals:   06/13/23 1119  BP: 125/84  Pulse: 81  Weight: 259 lb (117.5 kg)  Body mass index is 37.16 kg/m.        Physical Examination:   General appearance - well appearing, and in no distress  Mental status - alert, oriented to person, place,  and time  Psych:  She has a normal mood and affect  Skin - warm and dry, normal color, no suspicious lesions noted  Chest - effort normal, all lung fields clear to auscultation bilaterally  Heart - normal rate and regular rhythm  Neck:  midline trachea, no thyromegaly or nodules  Breasts - breasts appear normal, no suspicious masses, no skin or nipple changes or axillary nodes  Abdomen - soft, nontender, nondistended, no masses or organomegaly  Pelvic - VULVA: normal appearing vulva with no masses, tenderness or lesions  VAGINA: normal appearing vagina with normal color and discharge, no lesions  CERVIX: normal appearing cervix without discharge or lesions, no CMT  No significant prolapse seen today  Thin prep pap is not done   UTERUS: uterus is felt to be normal size, shape, consistency and nontender   ADNEXA: No adnexal masses or tenderness noted.  Extremities:  No swelling or varicosities noted  Chaperone present for exam  Assessment & Plan:  1. Well woman exam with routine gynecological exam (Primary) FHT normal  2. Uterine prolapse She showed a picture of her cervix prolapsed. Will refer to urogyn. If there is a significant delay, then may try pessary to temporize. - Ambulatory referral to Urogynecology    Labs/procedures today:   Orders Placed This Encounter  Procedures   Ambulatory referral to Urogynecology    Meds: No orders of  the defined types were placed in this encounter.   Follow-up: No follow-ups on file.  Levie Heritage, DO 06/13/2023 11:44 AM

## 2023-10-09 ENCOUNTER — Other Ambulatory Visit (HOSPITAL_COMMUNITY)
Admission: RE | Admit: 2023-10-09 | Discharge: 2023-10-09 | Disposition: A | Discharge status: Home / Self Care | Attending: Family Medicine | Admitting: Family Medicine

## 2023-10-09 ENCOUNTER — Ambulatory Visit

## 2023-10-09 ENCOUNTER — Other Ambulatory Visit (HOSPITAL_COMMUNITY)
Admission: RE | Admit: 2023-10-09 | Discharge: 2023-10-09 | Disposition: A | Discharge status: Home / Self Care | Source: Ambulatory Visit | Attending: Family Medicine | Admitting: Family Medicine

## 2023-10-09 VITALS — BP 119/79 | HR 83 | Wt 258.0 lb

## 2023-10-09 DIAGNOSIS — Z113 Encounter for screening for infections with a predominantly sexual mode of transmission: Secondary | ICD-10-CM | POA: Diagnosis not present

## 2023-10-09 DIAGNOSIS — R8290 Unspecified abnormal findings in urine: Secondary | ICD-10-CM | POA: Insufficient documentation

## 2023-10-09 NOTE — Progress Notes (Signed)
 Patient presents for STI testing. Patient also requests to be tested for BV and yeast. Self swab was sent to the lab.  Chevon Laufer l Elianis Fischbach, CMA

## 2023-10-12 ENCOUNTER — Ambulatory Visit: Payer: Self-pay | Admitting: Obstetrics and Gynecology

## 2023-10-12 DIAGNOSIS — B9689 Other specified bacterial agents as the cause of diseases classified elsewhere: Secondary | ICD-10-CM

## 2023-10-12 MED ORDER — METRONIDAZOLE 0.75 % VA GEL: 1.0000 | Freq: Every day | VAGINAL | 1 refills | Status: DC

## 2023-10-23 ENCOUNTER — Ambulatory Visit (INDEPENDENT_AMBULATORY_CARE_PROVIDER_SITE_OTHER): Admitting: Obstetrics

## 2023-10-23 ENCOUNTER — Encounter: Payer: Self-pay | Admitting: Obstetrics

## 2023-10-23 ENCOUNTER — Ambulatory Visit: Payer: Self-pay | Admitting: Obstetrics

## 2023-10-23 VITALS — BP 123/90 | HR 73 | Ht 68.7 in | Wt 257.8 lb

## 2023-10-23 DIAGNOSIS — M533 Sacrococcygeal disorders, not elsewhere classified: Secondary | ICD-10-CM

## 2023-10-23 DIAGNOSIS — Z6838 Body mass index (BMI) 38.0-38.9, adult: Secondary | ICD-10-CM | POA: Insufficient documentation

## 2023-10-23 DIAGNOSIS — R3914 Feeling of incomplete bladder emptying: Secondary | ICD-10-CM | POA: Insufficient documentation

## 2023-10-23 DIAGNOSIS — R351 Nocturia: Secondary | ICD-10-CM | POA: Diagnosis not present

## 2023-10-23 DIAGNOSIS — G8929 Other chronic pain: Secondary | ICD-10-CM | POA: Insufficient documentation

## 2023-10-23 DIAGNOSIS — R8290 Unspecified abnormal findings in urine: Secondary | ICD-10-CM | POA: Insufficient documentation

## 2023-10-23 DIAGNOSIS — N941 Unspecified dyspareunia: Secondary | ICD-10-CM | POA: Diagnosis not present

## 2023-10-23 DIAGNOSIS — N8110 Cystocele, unspecified: Secondary | ICD-10-CM | POA: Insufficient documentation

## 2023-10-23 LAB — POCT URINALYSIS DIP (CLINITEK)
Bilirubin, UA: NEGATIVE
Glucose, UA: NEGATIVE mg/dL
Ketones, POC UA: NEGATIVE mg/dL
Leukocytes, UA: NEGATIVE
Nitrite, UA: NEGATIVE
POC PROTEIN,UA: NEGATIVE
Spec Grav, UA: 1.02 (ref 1.010–1.025)
Urobilinogen, UA: 0.2 U/dL
pH, UA: 5.5 (ref 5.0–8.0)

## 2023-10-23 LAB — URINALYSIS, ROUTINE W REFLEX MICROSCOPIC
Bilirubin Urine: NEGATIVE
Glucose, UA: NEGATIVE mg/dL
Hgb urine dipstick: NEGATIVE
Ketones, ur: NEGATIVE mg/dL
Leukocytes,Ua: NEGATIVE
Nitrite: NEGATIVE
Protein, ur: NEGATIVE mg/dL
Specific Gravity, Urine: 1.012 (ref 1.005–1.030)
pH: 5 (ref 5.0–8.0)

## 2023-10-23 MED ORDER — DICLOFENAC SODIUM 1 % EX GEL
2.0000 g | Freq: Four times a day (QID) | CUTANEOUS | 2 refills | Status: AC
Start: 2023-10-23 — End: ?

## 2023-10-23 NOTE — Assessment & Plan Note (Addendum)
-   avoid fluid intake 3 hours before bedtime - due to snoring, consider sleep study to r/o sleep apnea - encouraged fluid management and reduction of intake to around 64oz/day

## 2023-10-23 NOTE — Assessment & Plan Note (Addendum)
-   tried pelvic floor PT without relief - For treatment of pelvic organ prolapse, we discussed options for management including expectant management, conservative management, and surgical management, such as Kegels, a pessary, pelvic floor physical therapy, and specific surgical procedures. - desires hysterectomy due to AUB-L - We discussed two options for prolapse repair:  1) vaginal repair without mesh - Pros - safer, no mesh complications - Cons - not as strong as mesh repair, higher risk of recurrence  2) laparoscopic repair with mesh - Pros - stronger, better long-term success - Cons - risks of mesh implant (erosion into vagina or bladder, adhering to the rectum, pain) - these risks are lower than with a vaginal mesh but still exist - pt desires native tissue repair - encouraged weight reduction due to risk of recurrence  - pt to return with full bladder for repeat CST

## 2023-10-23 NOTE — Assessment & Plan Note (Addendum)
-   pain with palpation of obturator internus bilaterally - improved with  position change - The origin of pelvic floor muscle spasm can be multifactorial, including primary, reactive to a different pain source, trauma, or even part of a centralized pain syndrome.Treatment options include pelvic floor physical therapy, local (vaginal) or oral  muscle relaxants, pelvic muscle trigger point injections or centrally acting pain medications.   - encouraged pelvic floor relaxation exercises  - explained that it may persist or worsen after treatment of prolapse and encouraged to consider pelvic floor PT

## 2023-10-23 NOTE — Assessment & Plan Note (Signed)
-   discussed association with pelvic floor disorders and increased risk of surgical failure - encouraged pt to continue efforts for weight reduction

## 2023-10-23 NOTE — Patient Instructions (Addendum)
 You have a stage 2 (out of 4) prolapse.  We discussed the fact that it is not life threatening but there are several treatment options. For treatment of pelvic organ prolapse, we discussed options for management including expectant management, conservative management, and surgical management, such as Kegels, a pessary, pelvic floor physical therapy, and specific surgical procedures.     We discussed two options for prolapse repair:  1) vaginal repair without mesh - Pros - safer, no mesh complications - Cons - not as strong as mesh repair, higher risk of recurrence  2) laparoscopic repair with mesh - Pros - stronger, better long-term success - Cons - risks of mesh implant (erosion into vagina or bladder, adhering to the rectum, pain) - these risks are lower than with a vaginal mesh but still exist  Start pelvic binder use for back pain when you are ambulating. You can use Ibuprofen , Tylenol  or heat for muscle pain.  Use Voltaren gel 2g up to 4 times a day over your back as needed for pain. Do not use heat over your back with Voltaren. The origin of pelvic floor muscle spasm can be multifactorial, including primary, reactive to a different pain source, trauma, or even part of a centralized pain syndrome.Treatment options include pelvic floor physical therapy, local (vaginal) or oral  muscle relaxants, pelvic muscle trigger point injections or centrally acting pain medications.     Women should try to eat at least 21 to 25 grams of fiber a day, while men should aim for 30 to 38 grams a day. You can add fiber to your diet with food or a fiber supplement such as psyllium (metamucil), benefiber, or fibercon.   Here's a look at how much dietary fiber is found in some common foods. When buying packaged foods, check the Nutrition Facts label for fiber content. It can vary among brands.  Fruits Serving size Total fiber (grams)*  Raspberries 1 cup 8.0  Pear 1 medium 5.5  Apple, with skin 1 medium 4.5   Banana 1 medium 3.0  Orange 1 medium 3.0  Strawberries 1 cup 3.0   Vegetables Serving size Total fiber (grams)*  Green peas, boiled 1 cup 9.0  Broccoli, boiled 1 cup chopped 5.0  Turnip greens, boiled 1 cup 5.0  Brussels sprouts, boiled 1 cup 4.0  Potato, with skin, baked 1 medium 4.0  Sweet corn, boiled 1 cup 3.5  Cauliflower, raw 1 cup chopped 2.0  Carrot, raw 1 medium 1.5   Grains Serving size Total fiber (grams)*  Spaghetti, whole-wheat, cooked 1 cup 6.0  Barley, pearled, cooked 1 cup 6.0  Bran flakes 3/4 cup 5.5  Quinoa, cooked 1 cup 5.0  Oat bran muffin 1 medium 5.0  Oatmeal, instant, cooked 1 cup 5.0  Popcorn, air-popped 3 cups 3.5  Brown rice, cooked 1 cup 3.5  Bread, whole-wheat 1 slice 2.0  Bread, rye 1 slice 2.0   Legumes, nuts and seeds Serving size Total fiber (grams)*  Split peas, boiled 1 cup 16.0  Lentils, boiled 1 cup 15.5  Black beans, boiled 1 cup 15.0  Baked beans, canned 1 cup 10.0  Chia seeds 1 ounce 10.0  Almonds 1 ounce (23 nuts) 3.5  Pistachios 1 ounce (49 nuts) 3.0  Sunflower kernels 1 ounce 3.0  *Rounded to nearest 0.5 gram. Source: [employer] for [employer]	[place]

## 2023-10-23 NOTE — Progress Notes (Signed)
 New Patient Evaluation and Consultation  Referring Provider: Barbra Lang PARAS, DO PCP: Stinson, Jacob J, DO Date of Service: 10/23/2023  SUBJECTIVE Chief Complaint: New Patient (Initial Visit) (Omni A Maue is a 38 y.o. female here today female organ prolapse.)  History of Present Illness: Shilpa A Mano is a 38 y.o. Black or African-American female seen in consultation at the request of Dr Barbra for evaluation of grade I pelvic organ prolapse.    Reports pain started 2 months postpartum after last pregnancy in 2021 with urge to void and self examines and notices cervix. Now splints for comfort daily.  Golf ball size vaginal bulge, denies bleeding or discharge.  Recently treated for BV on 10/12/23 with resolution of discharge.  Tried pelvic floor PT in 2023 without relief, discontinued due to cost Back pain daily since prolapse AUB-L with heavy bleeding, worsened with each pregnancy. Cycles shortened to 3-4 days soaking 5-6 pads/day (Always #4 pad). Previously offered OCPs, however difficulty with taking pills.   Review of records significant for: Diastasis recti, sickle cell trait, vertigo, fibroids  Urinary Symptoms: Does not leak urine.   Day time voids 6-7, increased since 2022 at onset of bulge, previously 3-4x/day .  Nocturia: 3 times per night to void. Reports snoring, denies sleep apnea Stops fluid intake around 9:30-10pm, sleeps around  Denies leg swelling  Voiding dysfunction:  does not empty bladder well.  Patient does not use a catheter to empty bladder.  When urinating, patient feels dribbling after finishing and the need to urinate multiple times in a row Drinks: 280oz water per day due to thirst, started 05/2023. Intermittent coffee 3x/week  UTIs: 0 UTI's in the last year.   Denies history of blood in urine, kidney or bladder stones, pyelonephritis, bladder cancer, and kidney cancer No results found for the last 90 days.   Pelvic Organ Prolapse  Symptoms:                  Patient Admits to a feeling of a bulge the vaginal area. It has been present for 3 years.  Patient Admits to seeing a bulge.  This bulge is bothersome.  Bowel Symptom: Bowel movements: 3-4 time(s) per day since bulge Stool consistency: loose Type IV or VII stool  Straining: yes  Splinting: no.  Incomplete evacuation: yes Patient Denies accidental bowel leakage / fecal incontinence Bowel regimen: none Last colonoscopy: not due for age based screening [facility] Colonoscopy   This patient has no relevant Health Maintenance data.     Sexual Function Sexually active: yes.  Sexual orientation: Straight Pain with sex: yes, deep in pelvis and discomfort since prolapse Position change with relief of discomfort  Pelvic Pain Denies pelvic pain   Past Medical History:  Past Medical History:  Diagnosis Date   Fibroid    Vaginal Pap smear, abnormal      Past Surgical History:   Past Surgical History:  Procedure Laterality Date   foot surgery right     INDUCED ABORTION     TUBAL LIGATION Bilateral 11/06/2019   Procedure: POST PARTUM TUBAL LIGATION;  Surgeon: Fredirick Glenys RAMAN, MD;  Location: [facility];  Service: Gynecology;  Laterality: Bilateral;     Past OB/GYN History: OB History  Gravida Para Term Preterm AB Living  4 3 3  0 1 3  SAB IAB Ectopic Multiple Live Births  0 1 0 0 3    # Outcome Date GA Lbr Len/2nd Weight Sex Type Anes PTL Lv  4  Term 11/05/19 [redacted]w[redacted]d 00:56 / 00:11 7 lb 7.8 oz (3.396 kg) F Vag-Spont EPI  LIV  3 IAB 2018          2 Term 02/22/06 [redacted]w[redacted]d  8 lb 13 oz (3.997 kg) M    LIV  1 Term 12/14/03 [redacted]w[redacted]d  8 lb 6 oz (3.799 kg) M Vag-Spont   LIV   Forceps/ Vacuum deliveries: 0, Cesarean section: 0 Menopausal: No, LMP Patient's last menstrual period was 09/17/2023 (exact date). 6/17-21/25 Contraception: bilateral salpingectomy. Last pap smear.  Any history of abnormal pap smears: yes. Denies LEEP or CKC, normal pap smear after repeat      Component Value Date/Time   DIAGPAP  10/04/2021 1000    - Negative for intraepithelial lesion or malignancy (NILM)   DIAGPAP  04/29/2019 1444    - Negative for intraepithelial lesion or malignancy (NILM)   HPVHIGH Negative 10/04/2021 1000   HPVHIGH Negative 04/29/2019 1444   ADEQPAP  10/04/2021 1000    Satisfactory for evaluation; transformation zone component ABSENT.   ADEQPAP  04/29/2019 1444    Satisfactory for evaluation; transformation zone component ABSENT.    Medications: Patient has a current medication list which includes the following prescription(s): diclofenac sodium.   Allergies: Patient has no known allergies.   Social History:  Social History   Tobacco Use   Smoking status: Never   Smokeless tobacco: Never  Vaping Use   Vaping status: Never Used  Substance Use Topics   Alcohol use: Yes    Comment: occ   Drug use: No    Relationship status: single Patient lives with her children.   Patient is employed in [employer]. Regular exercise: Yes: walks daily History of abuse: No  Family History:   Family History  Problem Relation Age of Onset   Hypertension Father    Asthma Son    Diabetes type I Niece    Cancer Neg Hx    Depression Neg Hx    Stroke Neg Hx    Bladder Cancer Neg Hx    Uterine cancer Neg Hx    Renal cancer Neg Hx      Review of Systems: Review of Systems  Constitutional:  Negative for fever, malaise/fatigue and weight loss.  Respiratory:  Negative for cough, shortness of breath and wheezing.   Cardiovascular:  Negative for chest pain, palpitations and leg swelling.  Gastrointestinal:  Negative for abdominal pain and blood in stool.  Genitourinary:  Negative for hematuria.  Skin:  Negative for rash.  Neurological:  Negative for dizziness, weakness and headaches.  Endo/Heme/Allergies:  Does not bruise/bleed easily.  Psychiatric/Behavioral:  Negative for depression. The patient is not nervous/anxious.      OBJECTIVE Physical  Exam: Vitals:   10/23/23 0811  BP: (!) 123/90  Pulse: 73  Weight: 257 lb 12.8 oz (116.9 kg)  Height: 5' 8.7 (1.745 m)    Physical Exam Constitutional:      General: She is not in acute distress.    Appearance: Normal appearance.  Genitourinary:     Bladder and urethral meatus normal.     No lesions in the vagina.     Right Labia: No rash, tenderness, lesions, skin changes or Bartholin's cyst.    Left Labia: No tenderness, lesions, skin changes, Bartholin's cyst or rash.    No vaginal discharge, erythema, tenderness, bleeding, ulceration or granulation tissue.     Anterior, posterior and apical vaginal prolapse present.    No vaginal atrophy present.  Right Adnexa: not tender, not full and no mass present.    Left Adnexa: not tender, not full and no mass present.    No cervical motion tenderness, discharge, friability, lesion, polyp or nabothian cyst.     Uterus is prolapsed.     Uterus is not enlarged, fixed, tender or irregular.     No uterine mass detected.    Urethral meatus caruncle not present.    No urethral prolapse, tenderness, mass, hypermobility, discharge or stress urinary incontinence with cough stress test present.     Bladder is not tender, urgency on palpation not present and masses not present.      Pelvic Floor: Levator muscle strength is 3/5.    Obturator internus is tender (bilateral).     Levator ani not tender, no asymmetrical contractions present and no pelvic spasms present.    Symmetrical pelvic sensation, anal wink present and BC reflex present.  Cardiovascular:     Rate and Rhythm: Normal rate.  Pulmonary:     Effort: Pulmonary effort is normal. No respiratory distress.  Abdominal:     General: There is no distension.     Palpations: Abdomen is soft. There is no mass.     Tenderness: There is no abdominal tenderness.     Hernia: A hernia is present.     Comments: Diastasis recti noted   Musculoskeletal:       Legs:   Neurological:      Mental Status: She is alert.  Vitals reviewed. Exam conducted with a chaperone present.      POP-Q:   POP-Q  1                                            Aa   2                                           Ba  2                                              C   5                                            Gh  3                                            Pb  10                                            tvl   -1                                            Ap  -  1                                            Bp  -1                                              D     Post-Void Residual (PVR) by Bladder Scan: In order to evaluate bladder emptying, we discussed obtaining a postvoid residual and patient agreed to this procedure.  Procedure: The ultrasound unit was placed on the patient's abdomen in the suprapubic region after the patient had voided.    Post Void Residual - 10/23/23 0828       Post Void Residual   Post Void Residual 76 mL           Laboratory Results: Lab Results  Component Value Date   COLORU yellow 10/23/2023   CLARITYU clear 10/23/2023   GLUCOSEUR negative 10/23/2023   BILIRUBINUR negative 10/23/2023   SPECGRAV 1.020 10/23/2023   RBCUR trace-intact (A) 10/23/2023   PHUR 5.5 10/23/2023   PROTEINUR NEGATIVE 09/17/2019   UROBILINOGEN 0.2 10/23/2023   LEUKOCYTESUR Negative 10/23/2023    Lab Results  Component Value Date   CREATININE 0.66 09/17/2019   CREATININE 1.10 (H) 06/16/2017    Lab Results  Component Value Date   HGBA1C 5.5 04/29/2019    Lab Results  Component Value Date   HGB 9.5 (L) 11/05/2019     ASSESSMENT AND PLAN Ms. Ege is a 38 y.o. with:  1. Dyspareunia in female   2. Nocturia   3. Feeling of incomplete bladder emptying   4. Chronic SI joint pain   5. BMI 38.0-38.9,adult   6. Abnormal urinalysis   7. Pelvic organ prolapse quantification stage 3 cystocele     Dyspareunia in female Assessment & Plan: -  pain with palpation of obturator internus bilaterally - improved with  position change - The origin of pelvic floor muscle spasm can be multifactorial, including primary, reactive to a different pain source, trauma, or even part of a centralized pain syndrome.Treatment options include pelvic floor physical therapy, local (vaginal) or oral  muscle relaxants, pelvic muscle trigger point injections or centrally acting pain medications.   - encouraged pelvic floor relaxation exercises  - explained that it may persist or worsen after treatment of prolapse and encouraged to consider pelvic floor PT   Nocturia Assessment & Plan: - avoid fluid intake 3 hours before bedtime - due to snoring, consider sleep study to r/o sleep apnea - encouraged fluid management and reduction of intake to around 64oz/day   Orders: -     POCT URINALYSIS DIP (CLINITEK) -     Hemoglobin A1c  Feeling of incomplete bladder emptying Assessment & Plan: - PVR 76mL - encouraged splinting as needed for sensation of incomplete emptying - repeat if clinical change   Chronic SI joint pain Assessment & Plan: - started since pregnancy - pain alleviated by stabilization of hips, encouraged pelvic binder use - encouraged sleeping with pillow between her legs on her side - encouraged weight reduction, NSAIDs, Tylenol , and stretching exercises  Orders: -     Diclofenac Sodium; Apply 2 g topically 4 (four) times daily.  Dispense: 100 g; Refill: 2  BMI 38.0-38.9,adult Assessment &  Plan: - discussed association with pelvic floor disorders and increased risk of surgical failure - encouraged pt to continue efforts for weight reduction   Abnormal urinalysis Assessment & Plan: - POCT UA + heme - pending UA microscopy - denies gross hematuria, tobacco use, pelvic radiation or chemotherapy  Orders: -     Urine Microscopic; Future  Pelvic organ prolapse quantification stage 3 cystocele Assessment & Plan: - tried pelvic  floor PT without relief - For treatment of pelvic organ prolapse, we discussed options for management including expectant management, conservative management, and surgical management, such as Kegels, a pessary, pelvic floor physical therapy, and specific surgical procedures. - desires hysterectomy due to AUB-L - We discussed two options for prolapse repair:  1) vaginal repair without mesh - Pros - safer, no mesh complications - Cons - not as strong as mesh repair, higher risk of recurrence  2) laparoscopic repair with mesh - Pros - stronger, better long-term success - Cons - risks of mesh implant (erosion into vagina or bladder, adhering to the rectum, pain) - these risks are lower than with a vaginal mesh but still exist - pt desires native tissue repair - encouraged weight reduction due to risk of recurrence  - pt to return with full bladder for repeat CST   Time spent: I spent 63 minutes dedicated to the care of this patient on the date of this encounter to include pre-visit review of records, face-to-face time with the patient discussing stage III pelvic organ prolapse, sensation of incomplete emptying, nocturia, SI joint pain, dyspareunia, abnormal urinalysis, BMI, and post visit documentation and ordering medication/ testing.   Lianne ONEIDA Gillis, MD

## 2023-10-23 NOTE — Assessment & Plan Note (Signed)
-   POCT UA + heme - pending UA microscopy - denies gross hematuria, tobacco use, pelvic radiation or chemotherapy

## 2023-10-23 NOTE — Assessment & Plan Note (Signed)
-   PVR 76mL - encouraged splinting as needed for sensation of incomplete emptying - repeat if clinical change

## 2023-10-23 NOTE — Assessment & Plan Note (Signed)
-   started since pregnancy - pain alleviated by stabilization of hips, encouraged pelvic binder use - encouraged sleeping with pillow between her legs on her side - encouraged weight reduction, NSAIDs, Tylenol , and stretching exercises

## 2023-10-24 ENCOUNTER — Ambulatory Visit: Admitting: Obstetrics

## 2023-10-24 NOTE — Progress Notes (Deleted)
 [facility] Return Visit  SUBJECTIVE  History of Present Illness: Dionisia A Braxton is a 38 y.o. female seen in follow-up for stage III pelvic organ prolapse, BMI 38, dyspareunia, nocturia, feeling of incomplete bladder emptying, SI joint pain, and abnormal urinalysis. Plan at last visit was return for repeat cough stress test and possible simple CMG, pelvic binder, NSAID use, HbA1C, splinting, and continue weight reduction.   ***pelvic binder Negative urinalysis for RBCs.   Past Medical History: Patient  has a past medical history of Fibroid and Vaginal Pap smear, abnormal.   Past Surgical History: She  has a past surgical history that includes foot surgery right; Induced abortion; and Tubal ligation (Bilateral, 11/06/2019).   Medications: She has a current medication list which includes the following prescription(s): diclofenac sodium.   Allergies: Patient has no known allergies.   Social History: Patient  reports that she has never smoked. She has never used smokeless tobacco. She reports current alcohol use. She reports that she does not use drugs.     OBJECTIVE     Physical Exam: There were no vitals filed for this visit. Gen: No apparent distress, A&O x 3.  CST ***      No data to display             ASSESSMENT AND PLAN    Ms. Leming is a 38 y.o. with:  No diagnosis found.  There are no diagnoses linked to this encounter.   Lianne ONEIDA Gillis, MD

## 2023-10-30 NOTE — Progress Notes (Deleted)
 [facility] Return Visit  SUBJECTIVE  History of Present Illness: Elvera A Vanhandel is a 38 y.o. female seen in follow-up for stage III pelvic organ prolapse, nocturia, dyspareunia, feeling of incomplete bladder emptying, chronic SI joint pain, and BMI. Plan at last visit was ***HbA1C, weight reduction, and return for repeat cough stress test.   *** sleep study, pelvic floor PT  Past Medical History: Patient  has a past medical history of Fibroid and Vaginal Pap smear, abnormal.   Past Surgical History: She  has a past surgical history that includes foot surgery right; Induced abortion; and Tubal ligation (Bilateral, 11/06/2019).   Medications: She has a current medication list which includes the following prescription(s): diclofenac  sodium.   Allergies: Patient has no known allergies.   Social History: Patient  reports that she has never smoked. She has never used smokeless tobacco. She reports current alcohol use. She reports that she does not use drugs.     OBJECTIVE     Physical Exam: There were no vitals filed for this visit. Gen: No apparent distress, A&O x 3.  Detailed Urogynecologic Evaluation:  Deferred. Prior exam showed:  ***CST      No data to display             ASSESSMENT AND PLAN    Ms. Ruvalcaba is a 38 y.o. with:  No diagnosis found.  There are no diagnoses linked to this encounter.   Lianne ONEIDA Gillis, MD

## 2023-10-31 ENCOUNTER — Ambulatory Visit: Admitting: Obstetrics

## 2024-01-20 ENCOUNTER — Other Ambulatory Visit (HOSPITAL_COMMUNITY)
Admission: RE | Admit: 2024-01-20 | Discharge: 2024-01-20 | Disposition: A | Discharge status: Home / Self Care | Source: Ambulatory Visit | Attending: Obstetrics & Gynecology | Admitting: Obstetrics & Gynecology

## 2024-01-20 ENCOUNTER — Ambulatory Visit (INDEPENDENT_AMBULATORY_CARE_PROVIDER_SITE_OTHER)

## 2024-01-20 VITALS — BP 133/82 | HR 75 | Ht 70.0 in | Wt 260.0 lb

## 2024-01-20 DIAGNOSIS — N898 Other specified noninflammatory disorders of vagina: Secondary | ICD-10-CM

## 2024-01-20 NOTE — Progress Notes (Signed)
 SUBJECTIVE:  38 y.o. female who desires a STI screen. Denies abnormal bleeding or significant pelvic pain. Having white discharge for 3-4 days No UTI symptoms. Denies history of known exposure to STD.  Patient's last menstrual period was 01/07/2024.  OBJECTIVE:  She appears well.   ASSESSMENT:  STI Screen   PLAN:  Pt offered STI blood screening-declined GC, chlamydia, and trichomonas probe sent to lab.  Treatment: To be determined once lab results are received.  Pt follow up as needed.   Erminio DELENA Rumps, RN

## 2024-01-21 ENCOUNTER — Ambulatory Visit: Payer: Self-pay | Admitting: Obstetrics & Gynecology

## 2024-01-21 DIAGNOSIS — B9689 Other specified bacterial agents as the cause of diseases classified elsewhere: Secondary | ICD-10-CM

## 2024-01-21 LAB — CERVICOVAGINAL ANCILLARY ONLY
Bacterial Vaginitis (gardnerella): POSITIVE — AB
Candida Glabrata: NEGATIVE
Candida Vaginitis: NEGATIVE
Chlamydia: NEGATIVE
Comment: NEGATIVE
Comment: NEGATIVE
Comment: NEGATIVE
Comment: NEGATIVE
Comment: NEGATIVE
Comment: NORMAL
Neisseria Gonorrhea: NEGATIVE
Trichomonas: NEGATIVE

## 2024-01-21 MED ORDER — METRONIDAZOLE 500 MG PO TABS: 500.0000 mg | ORAL_TABLET | Freq: Two times a day (BID) | ORAL | 0 refills | Status: AC
